# Patient Record
Sex: Female | Born: 1995 | Race: Black or African American | Hispanic: No | Marital: Single | Smoking: Never smoker
Health system: Southern US, Community
[De-identification: ages and names within clinical notes are randomized; demographics above are authoritative.]

## PROBLEM LIST (undated history)

## (undated) DIAGNOSIS — D649 Anemia, unspecified: Secondary | ICD-10-CM

## (undated) HISTORY — PX: WISDOM TOOTH EXTRACTION: SHX21

---
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
## 2015-02-23 NOTE — H&P (Signed)
L&D Evaluation:  History Expanded:  HPI 19 yo G1, EDD of 10/10/13 per 34 wk US. Pt presents with c/o regular ctx since 1930 last night, no LOF or VB. [facility] for late entry to care at 33 wks, Anemia.   Blood Type (Maternal) O positive   Group B Strep Results Maternal (Result >5wks must be treated as unknown) negative   Maternal HIV Negative   Maternal Syphilis Ab Nonreactive   Maternal Varicella Immune   Rubella Results (Maternal) immune   Patient's Medical History No Chronic Illness   Patient's Surgical History wisdom teeth   Medications Pre Natal Vitamins  colace   Allergies NKDA   Social History none   Exam:  Vital Signs stable   General no apparent distress   Mental Status clear   Chest no increased work of breathing   Abdomen gravid, tender with contractions   Pelvic no external lesions, 1/90/0 - unchanged after 1 hour   Mebranes Intact   FHT normal rate with no decels   Ucx regular   Impression:  Impression early labor   Plan:  Comments Offered therapeutic rest vs ambulation vs d/c home.  Pt opts for therapeutic rest - give Morphine 5 mg IM and recheck in am.   Electronic Signatures: Vella KohlerBrothers, Amberrose Friebel K (CNM)  (Signed 21-Dec-14 01:53)  Authored: L&D Evaluation   Last Updated: 21-Dec-14 01:53 by Vella KohlerBrothers, Wardell Pokorski K (CNM)

## 2015-03-29 ENCOUNTER — Encounter: Payer: Self-pay | Admitting: Emergency Medicine

## 2015-03-29 ENCOUNTER — Emergency Department
Admission: EM | Admit: 2015-03-29 | Discharge: 2015-03-29 | Disposition: A | Discharge status: Home / Self Care | Payer: Medicaid Other | Attending: Emergency Medicine | Admitting: Emergency Medicine

## 2015-03-29 DIAGNOSIS — L0291 Cutaneous abscess, unspecified: Secondary | ICD-10-CM

## 2015-03-29 DIAGNOSIS — L0231 Cutaneous abscess of buttock: Secondary | ICD-10-CM | POA: Insufficient documentation

## 2015-03-29 MED ORDER — HYDROCODONE-ACETAMINOPHEN 5-325 MG PO TABS
ORAL_TABLET | ORAL | Status: AC
  Administered 2015-03-29: 1 via ORAL
  Filled 2015-03-29: qty 1

## 2015-03-29 MED ORDER — HYDROCODONE-ACETAMINOPHEN 5-325 MG PO TABS: 1.0000 | ORAL_TABLET | ORAL | Status: DC | PRN

## 2015-03-29 MED ORDER — SULFAMETHOXAZOLE-TRIMETHOPRIM 800-160 MG PO TABS: 1.0000 | ORAL_TABLET | Freq: Two times a day (BID) | ORAL | Status: DC

## 2015-03-29 MED ORDER — HYDROCODONE-ACETAMINOPHEN 5-325 MG PO TABS
1.0000 | ORAL_TABLET | Freq: Once | ORAL | Status: AC
  Administered 2015-03-29: 1 via ORAL

## 2015-03-29 MED ORDER — LIDOCAINE-EPINEPHRINE (PF) 1 %-1:200000 IJ SOLN
INTRAMUSCULAR | Status: AC
  Administered 2015-03-29: 22:00:00
  Filled 2015-03-29: qty 30

## 2015-03-29 NOTE — Discharge Instructions (Signed)

## 2015-03-29 NOTE — ED Notes (Signed)
Pt to ED from home c/o abscess to right inner thigh.  Pt states started 4 days ago with pain.  Abscess popped on Saturday with yellow drainage and relieved pain.  Pain and abscess started back today.

## 2015-03-29 NOTE — ED Provider Notes (Signed)
[facility] Emergency Department Provider Note  ____________________________________________  Time seen: On arrival  I have reviewed the triage vital signs and the nursing notes.   HISTORY  Chief Complaint Boil     HPI Natalie Pierce is a 19 y.o. female who presents with complaints of pain and drainage on right buttock. She reports it started 4 days ago and it seemed like a small pimple at first but has gotten worse. It was draining yesterday but is no longer draining. She denies fevers chills nausea or vomiting. She has never had this before. She is worried it is a spider bite.     No past medical history on file.  There are no active problems to display for this patient.   No past surgical history on file.  No current outpatient prescriptions on file.  Allergies Review of patient's allergies indicates not on file.  No family history on file.  Social History History  Substance Use Topics  . Smoking status: Not on file  . Smokeless tobacco: Not on file  . Alcohol Use: Not on file    Review of Systems  Constitutional: Negative for fever.  Gastrointestinal: Negative for abdominal pain, vomiting and diarrhea. Genitourinary: Negative for dysuria. Musculoskeletal: Negative for back pain. Skin: Negative for rash. Positive for boil  Psychiatric: Positive for anxiety   ____________________________________________   PHYSICAL EXAM:  VITAL SIGNS: ED Triage Vitals  Enc Vitals Group     BP --      Pulse --      Resp --      Temp --      Temp src --      SpO2 --      Weight --      Height --      Head Cir --      Peak Flow --      Pain Score --      Pain Loc --      Pain Edu? --      Excl. in GC? --      Constitutional: Alert and oriented. Well appearing and in no distress.  Gastrointestinal: Soft and non-tender in all quadrants. No distention. There is no CVA tenderness. Genitourinary: deferred Musculoskeletal:  Nontender with normal range of motion in all extremities. No lower extremity tenderness nor edema. . Skin:  Patient with 2 x 2 centimeter area of erythema with mild underlying fluctuance inferior right buttock. Psychiatric: Mood and affect are normal. Patient exhibits appropriate insight and judgment.  ____________________________________________    LABS (pertinent positives/negatives)  Labs Reviewed - No data to display  ____________________________________________   EKG  None  ____________________________________________    RADIOLOGY  None  ____________________________________________   PROCEDURES  Procedure(s) performed: INCISION AND DRAINAGE Performed by: Jene Every Consent: Verbal consent obtained. Risks and benefits: risks, benefits and alternatives were discussed Type: abscess  Body area: buttock  Anesthesia: local infiltration  Incision was made with a scalpel.  Local anesthetic: lidocaine 1% with epinephrine  Anesthetic total: 51ml  Drainage: purulent  Drainage amount: 5 cc  Packing material: 1/4 in iodoform gauze  Patient tolerance: Patient tolerated the procedure poorly with no immediate complications.     Critical Care performed: none  ____________________________________________   INITIAL IMPRESSION / ASSESSMENT AND PLAN / ED COURSE  Pertinent labs & imaging results that were available during my care of the patient were reviewed by me and considered in my medical decision making (see chart for details).  Exam is  consistent with an abscess. We will I&D  ____________________________________________   FINAL CLINICAL IMPRESSION(S) / ED DIAGNOSES  Final diagnoses:  Abscess     Jene Every, MD 03/29/15 2139

## 2015-04-01 ENCOUNTER — Emergency Department
Admission: EM | Admit: 2015-04-01 | Discharge: 2015-04-01 | Disposition: A | Discharge status: Home / Self Care | Payer: Medicaid Other | Attending: Emergency Medicine | Admitting: Emergency Medicine

## 2015-04-01 ENCOUNTER — Encounter: Payer: Self-pay | Admitting: Emergency Medicine

## 2015-04-01 DIAGNOSIS — L0231 Cutaneous abscess of buttock: Secondary | ICD-10-CM | POA: Insufficient documentation

## 2015-04-01 DIAGNOSIS — Z792 Long term (current) use of antibiotics: Secondary | ICD-10-CM | POA: Diagnosis not present

## 2015-04-01 DIAGNOSIS — Z4801 Encounter for change or removal of surgical wound dressing: Secondary | ICD-10-CM | POA: Diagnosis present

## 2015-04-01 DIAGNOSIS — Z5189 Encounter for other specified aftercare: Secondary | ICD-10-CM

## 2015-04-01 NOTE — ED Notes (Signed)
Here for recheck of boil in her vaginal area, pt states she feels it is healing, site was packed by [employer].

## 2015-04-01 NOTE — Discharge Instructions (Signed)

## 2015-04-01 NOTE — ED Provider Notes (Signed)
CSN: [id number]     Arrival date & time 04/01/15  1826 History   First MD Initiated Contact with Patient 04/01/15 1846     Chief Complaint  Patient presents with  . Wound Check      HPI Comments: 19 year old female presents today for re-check of abscess that was drained 3 days ago in the ER by Dr. Jene Every. Packing was placed at that time, but she reports it has fallen out. She is having some drainage from the area. It hurts to sit and walk around. Taking Bactrim and Norco. No fevers, sweats, chills, nausea or body aches.   Patient is a 19 y.o. female presenting with wound check. The history is provided by the patient.  Wound Check This is a new problem. Episode onset: 3 days ago. The problem has been gradually improving. Pertinent negatives include no arthralgias, chills, fever or myalgias. The symptoms are aggravated by walking and standing. Treatments tried: oral narcotics  The treatment provided moderate relief.    History reviewed. No pertinent past medical history. Past Surgical History  Procedure Laterality Date  . Wisdom tooth extraction     No family history on file. History  Substance Use Topics  . Smoking status: Never Smoker   . Smokeless tobacco: Not on file  . Alcohol Use: No   OB History    No data available     Review of Systems  Constitutional: Negative for fever and chills.  Musculoskeletal: Negative for myalgias and arthralgias.  Skin: Positive for wound.  All other systems reviewed and are negative.     Allergies  Review of patient's allergies indicates no known allergies.  Home Medications   Prior to Admission medications   Medication Sig Start Date End Date Taking? Authorizing Provider  HYDROcodone-acetaminophen (NORCO/VICODIN) 5-325 MG per tablet Take 1 tablet by mouth every 4 (four) hours as needed for moderate pain. 03/29/15   Jene Every, MD  sulfamethoxazole-trimethoprim (BACTRIM DS,SEPTRA DS) 800-160 MG per tablet Take 1 tablet by  mouth 2 (two) times daily. 03/29/15   Jene Every, MD   Ht 5\' 5"  (1.651 m)  Wt 140 lb (63.504 kg)  BMI 23.30 kg/m2  LMP 02/26/2015 Physical Exam  Constitutional: She is oriented to person, place, and time. Vital signs are normal. She appears well-developed and well-nourished.  Non-toxic appearance. She does not have a sickly appearance. She does not appear ill.  HENT:  Head: Normocephalic and atraumatic.  Genitourinary:    Pelvic exam was performed with patient prone.  Above exam chaperoned by Eli Phillips, ER Tech.   Musculoskeletal: Normal range of motion.  Neurological: She is alert and oriented to person, place, and time.  Psychiatric: She has a normal mood and affect. Her behavior is normal. Judgment and thought content normal.  Nursing note and vitals reviewed.   ED Course  Procedures (including critical care time) Labs Review Labs Reviewed - No data to display  Imaging Review No results found.   EKG Interpretation None      MDM  Well healed area of prior abscess. Advised patient to continue sitz baths, take all bactrim course. Norco as needed. No further follow up unless area becomes larger, she has increased drainage or redness.  Final diagnoses:  Abscess of buttock  Encounter for wound re-check        Luvenia Redden, PA-C 04/01/15 1923

## 2016-05-16 ENCOUNTER — Emergency Department
Admission: EM | Admit: 2016-05-16 | Discharge: 2016-05-16 | Disposition: A | Discharge status: Home / Self Care | Payer: Medicaid Other | Attending: Emergency Medicine | Admitting: Emergency Medicine

## 2016-05-16 ENCOUNTER — Encounter: Payer: Self-pay | Admitting: Emergency Medicine

## 2016-05-16 ENCOUNTER — Emergency Department: Payer: Medicaid Other

## 2016-05-16 DIAGNOSIS — N83202 Unspecified ovarian cyst, left side: Secondary | ICD-10-CM | POA: Insufficient documentation

## 2016-05-16 DIAGNOSIS — R109 Unspecified abdominal pain: Secondary | ICD-10-CM

## 2016-05-16 DIAGNOSIS — R1030 Lower abdominal pain, unspecified: Secondary | ICD-10-CM | POA: Diagnosis present

## 2016-05-16 LAB — CBC
HEMATOCRIT: 37.6 % (ref 35.0–47.0)
HEMOGLOBIN: 13.1 g/dL (ref 12.0–16.0)
MCH: 31.4 pg (ref 26.0–34.0)
MCHC: 34.8 g/dL (ref 32.0–36.0)
MCV: 90.2 fL (ref 80.0–100.0)
Platelets: 244 10*3/uL (ref 150–440)
RBC: 4.17 MIL/uL (ref 3.80–5.20)
RDW: 13.7 % (ref 11.5–14.5)
WBC: 6.5 10*3/uL (ref 3.6–11.0)

## 2016-05-16 LAB — COMPREHENSIVE METABOLIC PANEL
ALBUMIN: 3.9 g/dL (ref 3.5–5.0)
ALT: 9 U/L — ABNORMAL LOW (ref 14–54)
ANION GAP: 7 (ref 5–15)
AST: 14 U/L — AB (ref 15–41)
Alkaline Phosphatase: 65 U/L (ref 38–126)
BILIRUBIN TOTAL: 0.6 mg/dL (ref 0.3–1.2)
BUN: 10 mg/dL (ref 6–20)
CHLORIDE: 106 mmol/L (ref 101–111)
CO2: 26 mmol/L (ref 22–32)
Calcium: 9.3 mg/dL (ref 8.9–10.3)
Creatinine, Ser: 0.64 mg/dL (ref 0.44–1.00)
GFR calc Af Amer: >60 mL/min (ref >60)
GFR calc non Af Amer: >60 mL/min (ref >60)
GLUCOSE: 97 mg/dL (ref 65–99)
POTASSIUM: 3.7 mmol/L (ref 3.5–5.1)
Sodium: 139 mmol/L (ref 135–145)
TOTAL PROTEIN: 7.6 g/dL (ref 6.5–8.1)

## 2016-05-16 LAB — WET PREP, GENITAL
Clue Cells Wet Prep HPF POC: NONE SEEN
Sperm: NONE SEEN
Trich, Wet Prep: NONE SEEN
YEAST WET PREP: NONE SEEN

## 2016-05-16 LAB — URINALYSIS COMPLETE WITH MICROSCOPIC (ARMC ONLY)
BACTERIA UA: NONE SEEN
Bilirubin Urine: NEGATIVE
Glucose, UA: NEGATIVE mg/dL
HGB URINE DIPSTICK: NEGATIVE
Ketones, ur: NEGATIVE mg/dL
LEUKOCYTES UA: NEGATIVE
NITRITE: NEGATIVE
PROTEIN: NEGATIVE mg/dL
RBC / HPF: NONE SEEN RBC/hpf (ref 0–5)
SPECIFIC GRAVITY, URINE: 1.018 (ref 1.005–1.030)
pH: 7 (ref 5.0–8.0)

## 2016-05-16 LAB — CHLAMYDIA/NGC RT PCR (ARMC ONLY)
Chlamydia Tr: NOT DETECTED
N GONORRHOEAE: NOT DETECTED

## 2016-05-16 LAB — PREGNANCY, URINE: PREG TEST UR: NEGATIVE

## 2016-05-16 LAB — LIPASE, BLOOD: LIPASE: 23 U/L (ref 11–51)

## 2016-05-16 MED ORDER — IBUPROFEN 600 MG PO TABS
600.0000 mg | ORAL_TABLET | Freq: Once | ORAL | Status: AC
  Administered 2016-05-16: 600 mg via ORAL
  Filled 2016-05-16: qty 1

## 2016-05-16 NOTE — ED Provider Notes (Signed)
[facility] Emergency Department Provider Note   ____________________________________________   None    (approximate)  I have reviewed the triage vital signs and the nursing notes.   HISTORY  Chief Complaint Back Pain and Abdominal Pain    HPI Natalie Pierce is a 20 y.o. female who comes into the hospital today with lower abdominal pain. She reports that the pain has come and gone during the day. The patient is also having some pain on both sides of her back. She reports that the pain in her back started over the weekend but the lower abdominal pain started today. The patient has never had this before. She reports that the pain is crampy. She has not taken anything for pain. She rates her pain a 5 out of 10 in intensity. The patient's last period was about 2 weeks ago. She's had no vomiting no diarrhea no chest pain, no shortness of breath, no headache, no blurred vision, no vaginal discharge no pain with urination. The patient was unsure what was going on so she is here for evaluation.   History reviewed. No pertinent past medical history.  There are no active problems to display for this patient.   Past Surgical History:  Procedure Laterality Date  . WISDOM TOOTH EXTRACTION      Prior to Admission medications   Not on File    Allergies Review of patient's allergies indicates no known allergies.  No family history on file.  Social History Social History  Substance Use Topics  . Smoking status: Never Smoker  . Smokeless tobacco: Never Used  . Alcohol use No    Review of Systems Constitutional: No fever/chills Eyes: No visual changes. ENT: No sore throat. Cardiovascular: Denies chest pain. Respiratory: Denies shortness of breath. Gastrointestinal:  abdominal pain.  No nausea, no vomiting.  No diarrhea.  No constipation. Genitourinary: Negative for dysuria. Musculoskeletal:  back pain. Skin: Negative for rash. Neurological:  Negative for headaches, focal weakness or numbness.  10-point ROS otherwise negative.  ____________________________________________   PHYSICAL EXAM:  VITAL SIGNS: ED Triage Vitals  Enc Vitals Group     BP 05/16/16 0407 126/82     Pulse Rate 05/16/16 0407 69     Resp 05/16/16 0407 18     Temp 05/16/16 0407 97.9 F (36.6 C)     Temp Source 05/16/16 0407 Oral     SpO2 05/16/16 0407 100 %     Weight 05/16/16 0407 150 lb (68 kg)     Height 05/16/16 0407  (1.651 m)     Head Circumference --      Peak Flow --      Pain Score 05/16/16 0408 6     Pain Loc --      Pain Edu? --      Excl. in GC? --     Constitutional: Alert and oriented. Well appearing and in Mild distress. Eyes: Conjunctivae are normal. PERRL. EOMI. Head: Atraumatic. Nose: No congestion/rhinnorhea. Mouth/Throat: Mucous membranes are moist.  Oropharynx non-erythematous. Cardiovascular: Normal rate, regular rhythm. Grossly normal heart sounds.  Good peripheral circulation. Respiratory: Normal respiratory effort.  No retractions. Lungs CTAB. Gastrointestinal: Soft with some left lower quadrant pain with palpation. No distention. Positive bowel sounds mild CVA tenderness. Genitourinary: Normal external genitalia, left adnexal tenderness to palpation, no bleeding, no significant discharge, no cervical motion tenderness. Musculoskeletal: No lower extremity tenderness nor edema.  . Neurologic:  Normal speech and language.  Skin:  Skin is warm,  dry and intact.  Psychiatric: Mood and affect are normal.   ____________________________________________   LABS (all labs ordered are listed, but only abnormal results are displayed)  Labs Reviewed  WET PREP, GENITAL - Abnormal; Notable for the following:       Result Value   WBC, Wet Prep HPF POC FEW (*)    All other components within normal limits  COMPREHENSIVE METABOLIC PANEL - Abnormal; Notable for the following:    AST 14 (*)    ALT 9 (*)    All other  components within normal limits  URINALYSIS COMPLETEWITH MICROSCOPIC ([facility] ONLY) - Abnormal; Notable for the following:    Color, Urine YELLOW (*)    APPearance CLEAR (*)    Squamous Epithelial / LPF 0-5 (*)    All other components within normal limits  CHLAMYDIA/NGC RT PCR ([facility] ONLY)  LIPASE, BLOOD  CBC  PREGNANCY, URINE   ____________________________________________  EKG  none ____________________________________________  RADIOLOGY  none ____________________________________________   PROCEDURES  Procedure(s) performed: None  Procedures  Critical Care performed: No  ____________________________________________   INITIAL IMPRESSION / ASSESSMENT AND PLAN / ED COURSE  Pertinent labs & imaging results that were available during my care of the patient were reviewed by me and considered in my medical decision making (see chart for details).  This is a 20 year old female who comes into the hospital today with left lower quadrant abdominal pain. She is also having some back pain. The patient's blood work and urine are unremarkable. I will send the patient for a pelvic ultrasound to evaluate for possible cyst causing her symptoms.  Clinical Course  Value Comment By Time  US Pelvis Complete Dominant physiologic follicle left ovary as described. No other extrauterine pelvic or adnexal mass. Uterus retroverted. No intrauterine lesion. Study otherwise unremarkable. No demonstrable ovarian torsion Rebecka Apley, MD 08/01 [phone]    The patient's wet prep is unremarkable. She'll be discharged home to follow-up with OB/GYN. I will give her a dose of ibuprofen here in the emergency department. ____________________________________________   FINAL CLINICAL IMPRESSION(S) / ED DIAGNOSES  Final diagnoses:  Abdominal pain  Cyst of left ovary      NEW MEDICATIONS STARTED DURING THIS VISIT:  New Prescriptions   No medications on file     Note:  This document  was prepared using Dragon voice recognition software and may include unintentional dictation errors.    Rebecka Apley, MD 05/16/16 [phone]

## 2016-05-16 NOTE — ED Notes (Signed)
Pt in US

## 2016-05-16 NOTE — ED Notes (Signed)
Patient transported to Ultrasound via stretcher 

## 2016-05-16 NOTE — ED Triage Notes (Signed)
Patient reports that she developed intermittent lower back pain over the weekend. Patient reports that tonight she developed left lower abd pain. Patient denies nausea/vomiting, diarrhea, fever or urinary symptoms.

## 2016-05-16 NOTE — ED Notes (Signed)
Pt c/o intermittent lower abdominal pain since today and back pain began over the weekend. Pt denies urinary symptoms, nausea, vomiting or diarrhea.

## 2017-10-16 NOTE — L&D Delivery Note (Signed)
Vaginal Delivery Note  Spontaneous delivery of nonviable female infant. Infant delivered suddenly by nurse at bedside. When I walked in the room infant was on the bed in a large puddle of meconium amniotic fluid. Infant dried and placed on maternal chest after confirming that this is what the patient desired. Mother distraught. Umbilical cord was clamped and cut. Placenta was delivered intact. There was no knot in the cord. No evidence of abruption with the placenta. There was not a nuchal cord or a body cord per nursing as the infant delivered. The patient had a first degree perineal laceration that was repaired with 2-0 vicryl. Uterus firm and below umbilicus at the end of the delivery.  Mom recovering in stable condition. Sponge and needle counts were correct at the end of the delivery. Inspection of infant showed a normal infant with no morphological abnormalities.   APGARS: 1 minute:0 5 minutes: 0  Natalie Idlerhristanna Eulalio Reamy MD [facility], [facility] 10/15/18 5:46 PM

## 2017-10-18 ENCOUNTER — Ambulatory Visit: Payer: Self-pay | Admitting: Advanced Practice Midwife

## 2017-11-16 ENCOUNTER — Ambulatory Visit (INDEPENDENT_AMBULATORY_CARE_PROVIDER_SITE_OTHER): Payer: Medicaid Other | Admitting: Advanced Practice Midwife

## 2017-11-16 ENCOUNTER — Encounter: Payer: Self-pay | Admitting: Advanced Practice Midwife

## 2017-11-16 VITALS — BP 120/90 | HR 74 | Ht 65.0 in | Wt 180.0 lb

## 2017-11-16 DIAGNOSIS — Z309 Encounter for contraceptive management, unspecified: Secondary | ICD-10-CM | POA: Diagnosis not present

## 2017-11-16 DIAGNOSIS — Z3049 Encounter for surveillance of other contraceptives: Secondary | ICD-10-CM | POA: Diagnosis not present

## 2017-11-16 DIAGNOSIS — Z30011 Encounter for initial prescription of contraceptive pills: Secondary | ICD-10-CM

## 2017-11-16 DIAGNOSIS — Z01419 Encounter for gynecological examination (general) (routine) without abnormal findings: Secondary | ICD-10-CM

## 2017-11-16 DIAGNOSIS — Z124 Encounter for screening for malignant neoplasm of cervix: Secondary | ICD-10-CM

## 2017-11-16 MED ORDER — NORGESTIMATE-ETH ESTRADIOL 0.25-35 MG-MCG PO TABS: 1.0000 | ORAL_TABLET | Freq: Every day | ORAL | 11 refills | Status: DC

## 2017-11-16 NOTE — Progress Notes (Signed)
Patient ID: Natalie Pierce, female   DOB: [birth year], 22 y.o.   MRN: [id number]     Gynecology Annual Exam  PCP: Clemetine Marker, FNP  Chief Complaint:  Chief Complaint  Patient presents with  . Gynecologic Exam    History of Present Illness: Patient is a 22 y.o. G1P1001 presents for annual exam. The patient has no complaints today. She has a Nexplanon that was placed 4 years ago and is overdue for taking out. Discussion of birth control options. Patient requests removal of Nexplanon and start OCP.    LMP: No LMP recorded. Menarche:not applicable Average Interval: no periods on Nexplanon Duration of flow: NA Heavy Menses: NA Clots: NA Intermenstrual Bleeding: NA Postcoital Bleeding: no Dysmenorrhea: not applicable  The patient is sexually active. She currently uses Nexplanon for contraception. She denies dyspareunia.  The patient does not perform self breast exams.  There is no notable family history of breast or ovarian cancer in her family.  The patient wears seatbelts: yes.  The patient has regular exercise: she walks some days about 10 minutes.    The patient denies current symptoms of depression.    Review of Systems: Review of Systems  Constitutional: Negative.   HENT: Negative.   Eyes: Negative.   Respiratory: Negative.   Cardiovascular: Negative.   Gastrointestinal: Negative.   Genitourinary: Negative.   Musculoskeletal: Negative.   Skin: Negative.   Neurological: Negative.   Endo/Heme/Allergies: Negative.   Psychiatric/Behavioral: Negative.     Past Medical History:  No past medical history on file.  Past Surgical History:  Past Surgical History:  Procedure Laterality Date  . WISDOM TOOTH EXTRACTION      Gynecologic History:  No LMP recorded. Contraception: Nexplanon Last Pap: Patient has never had a PAP   Obstetric History: G1P1001  Family History:  No family history on file.  Social History:  Social History   Socioeconomic History   . Marital status: Single    Spouse name: Not on file  . Number of children: Not on file  . Years of education: Not on file  . Highest education level: Not on file  Social Needs  . Financial resource strain: Not on file  . Food insecurity - worry: Not on file  . Food insecurity - inability: Not on file  . Transportation needs - medical: Not on file  . Transportation needs - non-medical: Not on file  Occupational History  . Not on file  Tobacco Use  . Smoking status: Never Smoker  . Smokeless tobacco: Never Used  Substance and Sexual Activity  . Alcohol use: No  . Drug use: No  . Sexual activity: Yes    Birth control/protection: Implant  Other Topics Concern  . Not on file  Social History Narrative  . Not on file    Allergies:  No Known Allergies  Medications: Prior to Admission medications   Medication Sig Start Date End Date Taking? Authorizing Provider  etonogestrel (NEXPLANON) 68 MG IMPL implant 1 each by Subdermal route once.   Yes [provider]    Physical Exam Vitals: Blood pressure 120/90, pulse 74, height 5\' 5"  (1.651 m), weight 180 lb (81.6 kg).  General: NAD HEENT: normocephalic, anicteric Thyroid: no enlargement, no palpable nodules Pulmonary: No increased work of breathing, CTAB Cardiovascular: RRR, distal pulses 2+ Breast: Breast symmetrical, no tenderness, no palpable nodules or masses, no skin or nipple retraction present, no nipple discharge.  No axillary or supraclavicular lymphadenopathy. Abdomen: NABS, soft, non-tender, non-distended.  Umbilicus without lesions.  No hepatomegaly, splenomegaly or masses palpable. No evidence of hernia  Genitourinary:  External: Normal external female genitalia.  Normal urethral meatus, normal  Bartholin's and Skene's glands.    Vagina: Normal vaginal mucosa, no evidence of prolapse.    Cervix: Grossly normal in appearance, no bleeding, no CMT  Uterus: Non-enlarged, mobile, normal contour.    Adnexa:  ovaries non-enlarged, no adnexal masses  Rectal: deferred  Lymphatic: no evidence of inguinal lymphadenopathy Extremities: no edema, erythema, or tenderness Neurologic: Grossly intact Psychiatric: mood appropriate, affect full   Assessment: 22 y.o. G1P1001 routine annual exam  Plan: Problem List Items Addressed This Visit    None      1) 4) Gardasil Series discussed and if applicable offered to patient - Patient does not know if she has had the vaccine  2) STI screening was offered and declined   3) ASCCP guidelines and rational discussed.  Patient opts for yearly screening interval  4) Contraception - Discontinue Nexplanon today and start oral combined pills  5) Encouraged increase healthy lifestyle diet and exercise  6) Follow up 1 year for routine annual exam  Tresea MallJane Suzann Lazaro, CNM     GYNECOLOGY PROCEDURE NOTE  Nexplanon removal discussed in detail.  Risks of infection, bleeding, nerve injury all reviewed.  Patient understands risks and desires to proceed.  Verbal consent obtained.  Patient is certain she wants the Nexplanon removed.  All questions answered.  Procedure: Patient placed in dorsal supine with left arm above head, elbow flexed at 90 degrees, arm resting on examination table.  Nexplanon identified without problems.  Betadine scrub x2.  1 ml of 1% lidocaine injected under Nexplanon device without problems.  Sterile gloves applied.  Small 0.5cm incision made at distal tip of Nexplanon device with 11 blade scalpel.  Nexplanon brought to incision and grasped with a small kelly clamp.  Nexplanon removed intact without problems.  Pressure applied to incision.  Hemostasis obtained.  Steri-strips applied, followed by bandage and compression dressing.  Patient tolerated procedure well.  No complications.   Assessment: 22 y.o. year old female now s/p uncomplicated Nexplanon removal.  Plan: 1.  Patient given post procedure precautions and asked to call for fever,  chills, redness or drainage from her incision, bleeding from incision.  She understands she will likely have a small bruise near site of removal and can remove bandage tomorrow and steri-strips in approximately 1 week.  2) Contraception: Sprintec  Tresea MallJane Shley Dolby, [place]   [id number] for lidocaine block, [phone] for nexplanon removal

## 2017-11-16 NOTE — Patient Instructions (Signed)
Preventive Care 18-39 Years, Female Preventive care refers to lifestyle choices and visits with your health care provider that can promote health and wellness. What does preventive care include?  A yearly physical exam. This is also called an annual well check.  Dental exams once or twice a year.  Routine eye exams. Ask your health care provider how often you should have your eyes checked.  Personal lifestyle choices, including: ? Daily care of your teeth and gums. ? Regular physical activity. ? Eating a healthy diet. ? Avoiding tobacco and drug use. ? Limiting alcohol use. ? Practicing safe sex. ? Taking vitamin and mineral supplements as recommended by your health care provider. What happens during an annual well check? The services and screenings done by your health care provider during your annual well check will depend on your age, overall health, lifestyle risk factors, and family history of disease. Counseling Your health care provider may ask you questions about your:  Alcohol use.  Tobacco use.  Drug use.  Emotional well-being.  Home and relationship well-being.  Sexual activity.  Eating habits.  Work and work Statistician.  Method of birth control.  Menstrual cycle.  Pregnancy history.  Screening You may have the following tests or measurements:  Height, weight, and BMI.  Diabetes screening. This is done by checking your blood sugar (glucose) after you have not eaten for a while (fasting).  Blood pressure.  Lipid and cholesterol levels. These may be checked every 5 years starting at age 32.  Skin check.  Hepatitis C blood test.  Hepatitis B blood test.  Sexually transmitted disease (STD) testing.  BRCA-related cancer screening. This may be done if you have a family history of breast, ovarian, tubal, or peritoneal cancers.  Pelvic exam and Pap test. This may be done every 3 years starting at age 68. Starting at age 6, this may be done  every 5 years if you have a Pap test in combination with an HPV test.  Discuss your test results, treatment options, and if necessary, the need for more tests with your health care provider. Vaccines Your health care provider may recommend certain vaccines, such as:  Influenza vaccine. This is recommended every year.  Tetanus, diphtheria, and acellular pertussis (Tdap, Td) vaccine. You may need a Td booster every 10 years.  Varicella vaccine. You may need this if you have not been vaccinated.  HPV vaccine. If you are 25 or younger, you may need three doses over 6 months.  Measles, mumps, and rubella (MMR) vaccine. You may need at least one dose of MMR. You may also need a second dose.  Pneumococcal 13-valent conjugate (PCV13) vaccine. You may need this if you have certain conditions and were not previously vaccinated.  Pneumococcal polysaccharide (PPSV23) vaccine. You may need one or two doses if you smoke cigarettes or if you have certain conditions.  Meningococcal vaccine. One dose is recommended if you are age 42-21 years and a first-year college student living in a residence hall, or if you have one of several medical conditions. You may also need additional booster doses.  Hepatitis A vaccine. You may need this if you have certain conditions or if you travel or work in places where you may be exposed to hepatitis A.  Hepatitis B vaccine. You may need this if you have certain conditions or if you travel or work in places where you may be exposed to hepatitis B.  Haemophilus influenzae type b (Hib) vaccine. You may need this  if you have certain risk factors.  Talk to your health care provider about which screenings and vaccines you need and how often you need them. This information is not intended to replace advice given to you by your health care provider. Make sure you discuss any questions you have with your health care provider. Document Released: 11/28/2001 Document Revised:  06/21/2016 Document Reviewed: 08/03/2015 Elsevier Interactive Patient Education  [employer].

## 2017-11-19 LAB — IGP, RFX APTIMA HPV ASCU: PAP Smear Comment: 0

## 2018-03-14 ENCOUNTER — Ambulatory Visit (INDEPENDENT_AMBULATORY_CARE_PROVIDER_SITE_OTHER): Payer: Medicaid Other | Admitting: Advanced Practice Midwife

## 2018-03-14 ENCOUNTER — Encounter: Payer: Self-pay | Admitting: Advanced Practice Midwife

## 2018-03-14 VITALS — BP 116/68 | HR 80 | Wt 177.0 lb

## 2018-03-14 DIAGNOSIS — Z3201 Encounter for pregnancy test, result positive: Secondary | ICD-10-CM | POA: Diagnosis not present

## 2018-03-14 DIAGNOSIS — Z3480 Encounter for supervision of other normal pregnancy, unspecified trimester: Secondary | ICD-10-CM | POA: Insufficient documentation

## 2018-03-14 DIAGNOSIS — Z113 Encounter for screening for infections with a predominantly sexual mode of transmission: Secondary | ICD-10-CM

## 2018-03-14 DIAGNOSIS — N912 Amenorrhea, unspecified: Secondary | ICD-10-CM

## 2018-03-14 LAB — POCT URINE PREGNANCY: PREG TEST UR: POSITIVE — AB

## 2018-03-14 NOTE — Addendum Note (Signed)
Addended by: Tresea Mall on: 03/14/2018 02:56 PM   Modules accepted: Orders

## 2018-03-14 NOTE — Addendum Note (Signed)
Addended by: [place], Lynsee Wands B on: 03/14/2018 03:29 PM   Modules accepted: Orders

## 2018-03-14 NOTE — Progress Notes (Signed)
New Obstetric Patient H&P    Chief Complaint: "Desires prenatal care"   History of Present Illness: Patient is a 22 y.o. G2P1001 Not Hispanic or Latino female, presents with amenorrhea and positive home pregnancy test. Patient's last menstrual period was 01/14/2018 (exact date). and based on her  LMP, her EDD is Estimated Date of Delivery: 10/21/18 and her EGA is [redacted]w[redacted]d. Cycles are 7. days, regular, and occur approximately every : 28 days. Her last pap smear was 3 months ago and was no abnormalities.    She had a urine pregnancy test which was positive 3 week(s)  ago. Her last menstrual period was normal and lasted for  7 day(s). Since her LMP she claims she has experienced breast tenderness, fatigue, nausea, vomiting. She denies vaginal bleeding. Her past medical history is noncontributory. Her prior pregnancies are notable for full term SVD.   Since her LMP, she admits to the use of tobacco products  no She claims she has gained   7 pounds since the start of her pregnancy.  There are cats in the home in the home  no  She admits close contact with children on a regular basis  yes  She has had chicken pox in the past no She has had Tuberculosis exposures, symptoms, or previously tested positive for TB   no Current or past history of domestic violence. no  Genetic Screening/Teratology Counseling: (Includes patient, baby's father, or anyone in either family with:)   1. Patient's age >/= 11 at [facility]  no 2. Thalassemia ([place], [place], Mediterranean, or Asian background): MCV<80  no 3. Neural tube defect (meningomyelocele, spina bifida, anencephaly)  no 4. Congenital heart defect  no  5. Down syndrome  no 6. Tay-Sachs (Jewish, [place]  no 7. Canavan's Disease  no 8. Sickle cell disease or trait (African)  no  9. Hemophilia or other blood disorders  no  10. Muscular dystrophy  no  11. Cystic fibrosis  no  12. Huntington's Chorea  no  13. Mental retardation/autism  no 14. Other  inherited genetic or chromosomal disorder  no 15. Maternal metabolic disorder (DM, PKU, etc)  no 16. Patient or FOB with a child with a birth defect not listed above no  16a. Patient or FOB with a birth defect themselves no 17. Recurrent pregnancy loss, or stillbirth  no  18. Any medications since LMP other than prenatal vitamins (include vitamins, supplements, OTC meds, drugs, alcohol)  no 19. Any other genetic/environmental exposure to discuss  no  Infection History:   1. Lives with someone with TB or TB exposed  no  2. Patient or partner has history of genital herpes  no 3. Rash or viral illness since LMP  no 4. History of STI (GC, CT, HPV, syphilis, HIV)  no 5. History of recent travel :  no  Other pertinent information:  no     Review of Systems:10 point review of systems negative unless otherwise noted in HPI  Past Medical History:  History reviewed. No pertinent past medical history.  Past Surgical History:  Past Surgical History:  Procedure Laterality Date  . WISDOM TOOTH EXTRACTION      Gynecologic History: Patient's last menstrual period was 01/14/2018 (exact date).  Obstetric History: G2P1001  Family History:  History reviewed. No pertinent family history.  Social History:  Social History   Socioeconomic History  . Marital status: Single    Spouse name: Not on file  . Number of children: Not on file  .  Years of education: Not on file  . Highest education level: Not on file  Occupational History  . Not on file  Social Needs  . Financial resource strain: Not on file  . Food insecurity:    Worry: Not on file    Inability: Not on file  . Transportation needs:    Medical: Not on file    Non-medical: Not on file  Tobacco Use  . Smoking status: Never Smoker  . Smokeless tobacco: Never Used  Substance and Sexual Activity  . Alcohol use: Yes    Comment: Occ  . Drug use: No  . Sexual activity: Yes    Birth control/protection: Implant  Lifestyle  .  Physical activity:    Days per week: Not on file    Minutes per session: Not on file  . Stress: Not on file  Relationships  . Social connections:    Talks on phone: Not on file    Gets together: Not on file    Attends religious service: Not on file    Active member of club or organization: Not on file    Attends meetings of clubs or organizations: Not on file    Relationship status: Not on file  . Intimate partner violence:    Fear of current or ex partner: Not on file    Emotionally abused: Not on file    Physically abused: Not on file    Forced sexual activity: Not on file  Other Topics Concern  . Not on file  Social History Narrative  . Not on file    Allergies:  No Known Allergies  Medications: Prior to Admission medications   Not on File    Physical Exam Vitals: Blood pressure 116/68, pulse 80, weight 177 lb (80.3 kg), last menstrual period 01/14/2018.  General: NAD HEENT: normocephalic, anicteric Thyroid: no enlargement, no palpable nodules Pulmonary: No increased work of breathing, CTAB Cardiovascular: RRR, distal pulses 2+ Abdomen: NABS, soft, non-tender, non-distended.  Umbilicus without lesions.  No hepatomegaly, splenomegaly or masses palpable. No evidence of hernia  Genitourinary:  External: Normal external female genitalia.  Normal urethral meatus, normal  Bartholin's and Skene's glands.    Vagina: Normal vaginal mucosa, no evidence of prolapse.    Cervix: Grossly normal in appearance, no bleeding, no CMT  Uterus: Enlarged, mobile, normal contour.    Adnexa: ovaries non-enlarged, no adnexal masses  Rectal: deferred Extremities: no edema, erythema, or tenderness Neurologic: Grossly intact Psychiatric: mood appropriate, affect full   Assessment: 22 y.o. G2P1001 at [redacted]w[redacted]d presenting to initiate prenatal care  Plan: 1) Avoid alcoholic beverages. 2) Patient encouraged not to smoke.  3) Discontinue the use of all non-medicinal drugs and chemicals.  4)  Take prenatal vitamins daily.  5) Nutrition, food safety (fish, cheese advisories, and high nitrite foods) and exercise discussed. 6) Hospital and practice style discussed with cross coverage system.  7) Genetic Screening, such as with 1st Trimester Screening, cell free fetal DNA, AFP testing, and Ultrasound, as well as with amniocentesis and CVS as appropriate, is discussed with patient. At the conclusion of today's visit patient requested cell free DNA genetic testing 8) Patient is asked about travel to areas at risk for the Zika virus, and counseled to avoid travel and exposure to mosquitoes or sexual partners who may have themselves been exposed to the virus. Testing is discussed, and will be ordered as appropriate.    Tresea Mall, CNM [facility], [facility] 03/14/2018, 10:25 AM

## 2018-03-14 NOTE — Patient Instructions (Signed)
Exercise During Pregnancy For people of all ages, exercise is an important part of being healthy. Exercise improves heart and lung function and helps to maintain strength, flexibility, and a healthy body weight. Exercise also boosts energy levels and elevates mood. For most women, maintaining an exercise routine throughout pregnancy is recommended. It is only on rare occasions and with certain medical conditions or pregnancy complications that women may be asked to limit or avoid exercise during pregnancy. What are some other benefits to exercising during pregnancy? Along with maintaining strength and flexibility, exercising throughout pregnancy can help to:  Keep strength in muscles that are very important during labor and childbirth.  Decrease low back pain during pregnancy.  Decrease the risk of developing gestational diabetes mellitus (GDM).  Improve blood sugar (glucose) control for women who have GDM.  Decrease the risk of developing preeclampsia. This is a serious condition that causes high blood pressure along with other symptoms, such as swelling and headaches.  Decrease the risk of cesarean delivery.  Speed up the recovery after giving birth.  How often should I exercise? Unless your health care provider gives you different instructions, you should try to exercise on most days or all days of the week. In general, try to exercise with moderate intensity for about 150 minutes per week. This can be spread out across several days, such as exercising for 30 minutes per day on 5 days of each week. You can tell that you are exercising at a moderate intensity if you have a higher heart rate and faster breathing, but you are still able to hold a conversation. What types of moderate-intensity exercise are recommended during pregnancy? There are many types of exercise that are safe for you to do during pregnancy. Unless your health care provider gives you different instructions, do a variety of  exercises that safely increase your heart and breathing (cardiopulmonary) rates and help you to build and maintain muscle strength (strength training). You should always be able to talk in full sentences while exercising during pregnancy. Some examples of exercising that is safe to do during pregnancy include:  Brisk walking or hiking.  Swimming.  Water aerobics.  Riding a stationary bike.  Strength training.  Modified yoga or Pilates. Tell your instructor that you are pregnant. Avoid overstretching and avoid lying on your back for long periods of time.  Running or jogging. Only choose this type of exercise if: ? You ran or jogged regularly before your pregnancy. ? You can run or jog and still talk in complete sentences.  What types of exercise should I not do during pregnancy? Depending on your level of fitness and whether you exercised regularly before your pregnancy, you may be advised to limit vigorous-intensity exercise during your pregnancy. You can tell that you are exercising at a vigorous intensity if you are breathing much harder and faster and cannot hold a conversation while exercising. Some examples of exercising that you should avoid during pregnancy include:  Contact sports.  Activities that place you at risk for falling on or being hit in the belly, such as downhill skiing, water skiing, surfing, rock climbing, cycling, gymnastics, and horseback riding.  Scuba diving.  Sky diving.  Yoga or Pilates in a room that is heated to extreme temperatures ("hot yoga" or "hot Pilates").  Jogging or running, unless you ran or jogged regularly before your pregnancy. While jogging or running, you should always be able to talk in full sentences. Do not run or jog so vigorously   that you are unable to have a conversation.  If you are not used to exercising at elevation (more than 6,000 feet above sea level), do not do so during your pregnancy.  When should I avoid exercising  during pregnancy? Certain medical conditions can make it unsafe to exercise during pregnancy, or they may increase your risk of miscarriage or early labor and birth. Some of these conditions include:  Some types of heart disease.  Some types of lung disease.  Placenta previa. This is when the placenta partially or completely covers the opening of the uterus (cervix).  Frequent bleeding from the vagina during your pregnancy.  Incompetent cervix. This is when your cervix does not remain as tightly closed during pregnancy as it should.  Premature labor.  Ruptured membranes. This is when the protective sac (amniotic sac) opens up and amniotic fluid leaks from your vagina.  Severely low blood count (anemia).  Preeclampsia or pregnancy-caused high blood pressure.  Carrying more than one baby (multiple gestation) and having an additional risk of early labor.  Poorly controlled diabetes.  Being severely underweight or severely overweight.  Intrauterine growth restriction. This is when your baby's growth and development during pregnancy are slower than expected.  Other medical conditions. Ask your health care provider if any apply to you.  What else should I know about exercising during pregnancy? You should take these precautions while exercising during pregnancy:  Avoid overheating. ? Wear loose-fitting, breathable clothes. ? Do not exercise in very high temperatures.  Avoid dehydration. Drink enough water before, during, and after exercise to keep your urine clear or pale yellow.  Avoid overstretching. Because of hormone changes during pregnancy, it is easy to overstretch muscles, tendons, and ligaments during pregnancy.  Start slowly and ask your health care provider to recommend types of exercise that are safe for you, if exercising regularly is new for you.  Pregnancy is not a time for exercising to lose weight. When should I seek medical care? You should stop exercising  and call your health care provider if you have any unusual symptoms, such as:  Mild uterine contractions or abdominal cramping.  Dizziness that does not improve with rest.  When should I seek immediate medical care? You should stop exercising and call your local emergency services (911 in the [place] if you have any unusual symptoms, such as:  Sudden, severe pain in your low back or your belly.  Uterine contractions or abdominal cramping that do not improve with rest.  Chest pain.  Bleeding or fluid leaking from your vagina.  Shortness of breath.  This information is not intended to replace advice given to you by your health care provider. Make sure you discuss any questions you have with your health care provider. Document Released: 10/02/2005 Document Revised: 03/01/2016 Document Reviewed: 12/10/2014 [employer] Interactive Patient Education  2018 [employer] Eating Plan for Pregnant Women While you are pregnant, your body will require additional nutrition to help support your growing baby. It is recommended that you consume:  150 additional calories each day during your first trimester.  300 additional calories each day during your second trimester.  300 additional calories each day during your third trimester.  Eating a healthy, well-balanced diet is very important for your health and for your baby's health. You also have a higher need for some vitamins and minerals, such as folic acid, calcium, iron, and vitamin D. What do I need to know about eating during pregnancy?  Do not try to lose weight   or go on a diet during pregnancy.  Choose healthy, nutritious foods. Choose  of a sandwich with a glass of milk instead of a candy bar or a high-calorie sugar-sweetened beverage.  Limit your overall intake of foods that have "empty calories." These are foods that have little nutritional value, such as sweets, desserts, candies, sugar-sweetened beverages, and fried foods.  Eat a  variety of foods, especially fruits and vegetables.  Take a prenatal vitamin to help meet the additional needs during pregnancy, specifically for folic acid, iron, calcium, and vitamin D.  Remember to stay active. Ask your health care provider for exercise recommendations that are specific to you.  Practice good food safety and cleanliness, such as washing your hands before you eat and after you prepare raw meat. This helps to prevent foodborne illnesses, such as listeriosis, that can be very dangerous for your baby. Ask your health care provider for more information about listeriosis. What does 150 extra calories look like? Healthy options for an additional 150 calories each day could be any of the following:  Plain low-fat yogurt (6-8 oz) with  cup of berries.  1 apple with 2 teaspoons of peanut butter.  Cut-up vegetables with  cup of hummus.  Low-fat chocolate milk (8 oz or 1 cup).  1 string cheese with 1 medium orange.   of a peanut butter and jelly sandwich on whole-wheat bread (1 tsp of peanut butter).  For 300 calories, you could eat two of those healthy options each day. What is a healthy amount of weight to gain? The recommended amount of weight for you to gain is based on your pre-pregnancy BMI. If your pre-pregnancy BMI was:  Less than 18 (underweight), you should gain 28-40 lb.  18-24.9 (normal), you should gain 25-35 lb.  25-29.9 (overweight), you should gain 15-25 lb.  Greater than 30 (obese), you should gain 11-20 lb.  What if I am having twins or multiples? Generally, pregnant women who will be having twins or multiples may need to increase their daily calories by 300-600 calories each day. The recommended range for total weight gain is 25-54 lb, depending on your pre-pregnancy BMI. Talk with your health care provider for specific guidance about additional nutritional needs, weight gain, and exercise during your pregnancy. What foods can I eat? Grains Any  grains. Try to choose whole grains, such as whole-wheat bread, oatmeal, or brown rice. Vegetables Any vegetables. Try to eat a variety of colors and types of vegetables to get a full range of vitamins and minerals. Remember to wash your vegetables well before eating. Fruits Any fruits. Try to eat a variety of colors and types of fruit to get a full range of vitamins and minerals. Remember to wash your fruits well before eating. Meats and Other Protein Sources Lean meats, including chicken, turkey, fish, and lean cuts of beef, veal, or pork. Make sure that all meats are cooked to "well done." Tofu. Tempeh. Beans. Eggs. Peanut butter and other nut butters. Seafood, such as shrimp, crab, and lobster. If you choose fish, select types that are higher in omega-3 fatty acids, including salmon, herring, mussels, trout, sardines, and pollock. Make sure that all meats are cooked to food-safe temperatures. Dairy Pasteurized milk and milk alternatives. Pasteurized yogurt and pasteurized cheese. Cottage cheese. Sour cream. Beverages Water. Juices that contain 100% fruit juice or vegetable juice. Caffeine-free teas and decaffeinated coffee. Drinks that contain caffeine are okay to drink, but it is better to avoid caffeine. Keep your total caffeine   intake to less than 200 mg each day (12 oz of coffee, tea, or soda) or as directed by your health care provider. Condiments Any pasteurized condiments. Sweets and Desserts Any sweets and desserts. Fats and Oils Any fats and oils. The items listed above may not be a complete list of recommended foods or beverages. Contact your dietitian for more options. What foods are not recommended? Vegetables Unpasteurized (raw) vegetable juices. Fruits Unpasteurized (raw) fruit juices. Meats and Other Protein Sources Cured meats that have nitrates, such as bacon, salami, and hotdogs. Luncheon meats, bologna, or other deli meats (unless they are reheated until they are  steaming hot). Refrigerated pate, meat spreads from a meat counter, smoked seafood that is found in the refrigerated section of a store. Raw fish, such as sushi or sashimi. High mercury content fish, such as tilefish, shark, swordfish, and king mackerel. Raw meats, such as tuna or beef tartare. Undercooked meats and poultry. Make sure that all meats are cooked to food-safe temperatures. Dairy Unpasteurized (raw) milk and any foods that have raw milk in them. Soft cheeses, such as feta, queso blanco, queso fresco, Brie, Camembert cheeses, blue-veined cheeses, and Panela cheese (unless it is made with pasteurized milk, which must be stated on the label). Beverages Alcohol. Sugar-sweetened beverages, such as sodas, teas, or energy drinks. Condiments Homemade fermented foods and drinks, such as pickles, sauerkraut, or kombucha drinks. (Store-bought pasteurized versions of these are okay.) Other Salads that are made in the store, such as ham salad, chicken salad, egg salad, tuna salad, and seafood salad. The items listed above may not be a complete list of foods and beverages to avoid. Contact your dietitian for more information. This information is not intended to replace advice given to you by your health care provider. Make sure you discuss any questions you have with your health care provider. Document Released: 07/17/2014 Document Revised: 03/09/2016 Document Reviewed: 03/17/2014 [employer] Interactive Patient Education  2018 [employer] Prenatal Care WHAT IS PRENATAL CARE? Prenatal care is the process of caring for a pregnant woman before she gives birth. Prenatal care makes sure that she and her baby remain as healthy as possible throughout pregnancy. Prenatal care may be provided by a midwife, family practice health care provider, or a childbirth and pregnancy specialist (obstetrician). Prenatal care may include physical examinations, testing, treatments, and education on nutrition, lifestyle, and  social support services. WHY IS PRENATAL CARE SO IMPORTANT? Early and consistent prenatal care increases the chance that you and your baby will remain healthy throughout your pregnancy. This type of care also decreases a baby's risk of being born too early (prematurely), or being born smaller than expected (small for gestational age). Any underlying medical conditions you may have that could pose a risk during your pregnancy are discussed during prenatal care visits. You will also be monitored regularly for any new conditions that may arise during your pregnancy so they can be treated quickly and effectively. WHAT HAPPENS DURING PRENATAL CARE VISITS? Prenatal care visits may include the following: Discussion Tell your health care provider about any new signs or symptoms you have experienced since your last visit. These might include:  Nausea or vomiting.  Increased or decreased level of energy.  Difficulty sleeping.  Back or leg pain.  Weight changes.  Frequent urination.  Shortness of breath with physical activity.  Changes in your skin, such as the development of a rash or itchiness.  Vaginal discharge or bleeding.  Feelings of excitement or nervousness.  Changes in   your baby's movements.  You may want to write down any questions or topics you want to discuss with your health care provider and bring them with you to your appointment. Examination During your first prenatal care visit, you will likely have a complete physical exam. Your health care provider will often examine your vagina, cervix, and the position of your uterus, as well as check your heart, lungs, and other body systems. As your pregnancy progresses, your health care provider will measure the size of your uterus and your baby's position inside your uterus. He or she may also examine you for early signs of labor. Your prenatal visits may also include checking your blood pressure and, after about 10-12 weeks of  pregnancy, listening to your baby's heartbeat. Testing Regular testing often includes:  Urinalysis. This checks your urine for glucose, protein, or signs of infection.  Blood count. This checks the levels of white and red blood cells in your body.  Tests for sexually transmitted infections (STIs). Testing for STIs at the beginning of pregnancy is routinely done and is required in many states.  Antibody testing. You will be checked to see if you are immune to certain illnesses, such as rubella, that can affect a developing fetus.  Glucose screen. Around 24-28 weeks of pregnancy, your blood glucose level will be checked for signs of gestational diabetes. Follow-up tests may be recommended.  Group B strep. This is a bacteria that is commonly found inside a woman's vagina. This test will inform your health care provider if you need an antibiotic to reduce the amount of this bacteria in your body prior to labor and childbirth.  Ultrasound. Many pregnant women undergo an ultrasound screening around 18-20 weeks of pregnancy to evaluate the health of the fetus and check for any developmental abnormalities.  HIV (human immunodeficiency virus) testing. Early in your pregnancy, you will be screened for HIV. If you are at high risk for HIV, this test may be repeated during your third trimester of pregnancy.  You may be offered other testing based on your age, personal or family medical history, or other factors. HOW OFTEN SHOULD I PLAN TO SEE MY HEALTH CARE PROVIDER FOR PRENATAL CARE? Your prenatal care check-up schedule depends on any medical conditions you have before, or develop during, your pregnancy. If you do not have any underlying medical conditions, you will likely be seen for checkups:  Monthly, during the first 6 months of pregnancy.  Twice a month during months 7 and 8 of pregnancy.  Weekly starting in the 9th month of pregnancy and until delivery.  If you develop signs of early labor  or other concerning signs or symptoms, you may need to see your health care provider more often. Ask your health care provider what prenatal care schedule is best for you. WHAT CAN I DO TO KEEP MYSELF AND MY BABY AS HEALTHY AS POSSIBLE DURING MY PREGNANCY?  Take a prenatal vitamin containing 400 micrograms (0.4 mg) of folic acid every day. Your health care provider may also ask you to take additional vitamins such as iodine, vitamin D, iron, copper, and zinc.  Take 1500-2000 mg of calcium daily starting at your 20th week of pregnancy until you deliver your baby.  Make sure you are up to date on your vaccinations. Unless directed otherwise by your health care provider: ? You should receive a tetanus, diphtheria, and pertussis (Tdap) vaccination between the 27th and 36th week of your pregnancy, regardless of when your last Tdap immunization   occurred. This helps protect your baby from whooping cough (pertussis) after he or she is born. ? You should receive an annual inactivated influenza vaccine (IIV) to help protect you and your baby from influenza. This can be done at any point during your pregnancy.  Eat a well-rounded diet that includes: ? Fresh fruits and vegetables. ? Lean proteins. ? Calcium-rich foods such as milk, yogurt, hard cheeses, and dark, leafy greens. ? Whole grain breads.  Do noteat seafood high in mercury, including: ? Swordfish. ? Tilefish. ? Shark. ? King mackerel. ? More than 6 oz tuna per week.  Do not eat: ? Raw or undercooked meats or eggs. ? Unpasteurized foods, such as soft cheeses (brie, blue, or feta), juices, and milks. ? Lunch meats. ? Hot dogs that have not been heated until they are steaming.  Drink enough water to keep your urine clear or pale yellow. For many women, this may be 10 or more 8 oz glasses of water each day. Keeping yourself hydrated helps deliver nutrients to your baby and may prevent the start of pre-term uterine contractions.  Do not use  any tobacco products including cigarettes, chewing tobacco, or electronic cigarettes. If you need help quitting, ask your health care provider.  Do not drink beverages containing alcohol. No safe level of alcohol consumption during pregnancy has been determined.  Do not use any illegal drugs. These can harm your developing baby or cause a miscarriage.  Ask your health care provider or pharmacist before taking any prescription or over-the-counter medicines, herbs, or supplements.  Limit your caffeine intake to no more than 200 mg per day.  Exercise. Unless told otherwise by your health care provider, try to get 30 minutes of moderate exercise most days of the week. Do not  do high-impact activities, contact sports, or activities with a high risk of falling, such as horseback riding or downhill skiing.  Get plenty of rest.  Avoid anything that raises your body temperature, such as hot tubs and saunas.  If you own a cat, do not empty its litter box. Bacteria contained in cat feces can cause an infection called toxoplasmosis. This can result in serious harm to the fetus.  Stay away from chemicals such as insecticides, lead, mercury, and cleaning or paint products that contain solvents.  Do not have any X-rays taken unless medically necessary.  Take a childbirth and breastfeeding preparation class. Ask your health care provider if you need a referral or recommendation.  This information is not intended to replace advice given to you by your health care provider. Make sure you discuss any questions you have with your health care provider. Document Released: 10/05/2003 Document Revised: 03/06/2016 Document Reviewed: 12/17/2013 [employer] Interactive Patient Education  2017 [employer]  

## 2018-03-14 NOTE — Progress Notes (Signed)
NOB N&V first thing in AM

## 2018-03-15 LAB — URINE DRUG PANEL 7
Amphetamines, Urine: NEGATIVE ng/mL
BENZODIAZEPINE QUANT UR: NEGATIVE ng/mL
Barbiturate Quant, Ur: NEGATIVE ng/mL
CANNABINOID QUANT UR: NEGATIVE ng/mL
COCAINE (METAB.): NEGATIVE ng/mL
OPIATE QUANT UR: NEGATIVE ng/mL
PCP QUANT UR: NEGATIVE ng/mL

## 2018-03-16 LAB — CHLAMYDIA/GONOCOCCUS/TRICHOMONAS, NAA
CHLAMYDIA BY NAA: NEGATIVE
Gonococcus by NAA: NEGATIVE
Trich vag by NAA: NEGATIVE

## 2018-03-16 LAB — URINE CULTURE: ORGANISM ID, BACTERIA: NO GROWTH

## 2018-03-29 ENCOUNTER — Ambulatory Visit (INDEPENDENT_AMBULATORY_CARE_PROVIDER_SITE_OTHER): Payer: Medicaid Other

## 2018-03-29 ENCOUNTER — Encounter: Payer: Self-pay | Admitting: Obstetrics and Gynecology

## 2018-03-29 ENCOUNTER — Ambulatory Visit (INDEPENDENT_AMBULATORY_CARE_PROVIDER_SITE_OTHER): Payer: Medicaid Other | Admitting: Obstetrics and Gynecology

## 2018-03-29 VITALS — BP 116/70 | Wt 175.0 lb

## 2018-03-29 DIAGNOSIS — Z3A10 10 weeks gestation of pregnancy: Secondary | ICD-10-CM

## 2018-03-29 DIAGNOSIS — Z3480 Encounter for supervision of other normal pregnancy, unspecified trimester: Secondary | ICD-10-CM

## 2018-03-29 DIAGNOSIS — Z3483 Encounter for supervision of other normal pregnancy, third trimester: Secondary | ICD-10-CM

## 2018-03-29 DIAGNOSIS — Z113 Encounter for screening for infections with a predominantly sexual mode of transmission: Secondary | ICD-10-CM

## 2018-03-29 DIAGNOSIS — Z1379 Encounter for other screening for genetic and chromosomal anomalies: Secondary | ICD-10-CM

## 2018-03-29 LAB — OB RESULTS CONSOLE VARICELLA ZOSTER ANTIBODY, IGG: Varicella: IMMUNE

## 2018-03-29 NOTE — Progress Notes (Signed)
Routine Prenatal Care Visit  Subjective  Natalie Pierce is a 22 y.o. G2P1001 at 6656w4d being seen today for ongoing prenatal care.  She is currently monitored for the following issues for this low-risk pregnancy and has Supervision of other normal pregnancy, antepartum on their problem list.  ----------------------------------------------------------------------------------- Patient reports no complaints.    .  .   . Denies leaking of fluid.  ----------------------------------------------------------------------------------- The following portions of the patient's history were reviewed and updated as appropriate: allergies, current medications, past family history, past medical history, past social history, past surgical history and problem list. Problem list updated.   Objective  Blood pressure 116/70, weight 175 lb (79.4 kg), last menstrual period 01/14/2018. Pregravid weight 170 lb (77.1 kg) Total Weight Gain 5 lb (2.268 kg) Urinalysis: Urine Protein: Trace Urine Glucose: Negative  Fetal Status:           General:  Alert, oriented and cooperative. Patient is in no acute distress.  Skin: Skin is warm and dry. No rash noted.   Cardiovascular: Normal heart rate noted  Respiratory: Normal respiratory effort, no problems with respiration noted  Abdomen: Soft, gravid, appropriate for gestational age.       Pelvic:  Cervical exam deferred        Extremities: Normal range of motion.     Mental Status: Normal mood and affect. Normal behavior. Normal judgment and thought content.   [place] Ob Comp Less 14 Wks  Result Date: 03/29/2018 ULTRASOUND REPORT Patient Name: Sherie Donmoni D Lamison DOB: 11/11/1995 MRN: [id number] Location: [facility] Date of Service: 03/29/2018 Indications: pregnancy dating Findings: Singleton intrauterine pregnancy is visualized with a CRL consistent with 4556w4d gestation, giving an (U/S) EDD of 10/21/2018. The (U/S) EDD is consistent with the clinically established EDD of  10/21/2018. FHR: 186 bpm CRL measurement: 36.3 mm Yolk sac is not visualized and early anatomy is normal. Amnion: not visualized Right Ovary is not identified Left Ovary is noidentified Corpus luteal cyst:  is not visualized Survey of the adnexa demonstrates no adnexal masses. There is no free peritoneal fluid in the cul de sac. Impression: 1. 6356w4d Viable Singleton Intrauterine pregnancy by U/S. 2. (U/S) EDD is consistent with Clinically established EDD of 10/21/2018. Recommendations: 1.Clinical correlation with the patient's History and Physical Exam. Willette AlmaKristen Priestley, RDMS, RVT There is a viable singleton gestation.  Detailed evaluation of the fetal anatomy is precluded by early gestational age.  It must be noted that a normal ultrasound particular at this early gestational age is unable to rule out fetal aneuploidy, risk of first trimester miscarriage, or anatomic birth defects. Thomasene MohairStephen Geoff Dacanay, MD, Merlinda FrederickFACOG [facility], [facility] 03/29/2018 3:54 PM      Assessment   21 y.o. G2P1001 at 1956w4d by  10/21/2018, by Last Menstrual Period presenting for routine prenatal visit  Plan   Pregnancy#2 Problems (from 01/14/18 to present)    Problem Noted Resolved   Supervision of other normal pregnancy, antepartum 03/14/2018 by Tresea MallGledhill, Jane, CNM No   Overview Signed 03/14/2018 10:23 AM by Tresea MallGledhill, Jane, CNM    [facility]inic [facility] Prenatal Labs  Dating  Blood type:     Genetic Screen 1 Screen:    AFP:     Quad:     NIPS: Antibody:   Anatomic US  Rubella:   Varicella: @VZVIGG @  GTT Early:               Third trimester:  RPR:     Rhogam  HBsAg:  TDaP vaccine                       Flu Shot: HIV:     Baby Food  Breast                               GBS:   Contraception  Pap:  CBB     CS/VBAC NA   Support Person  Boyfriend Trayvance          Preterm labor symptoms and general obstetric precautions including but not limited to vaginal bleeding, contractions, leaking of fluid and fetal  movement were reviewed in detail with the patient. Please refer to After Visit Summary for other counseling recommendations.   - NOB labs today - u/s confirms EDD - genetic screening with carrier testing today  Return in about 1 month (around 04/26/2018) for Routine Prenatal Appointment.  Thomasene Mohair, MD, [facility], [facility] 03/29/2018 4:09 PM

## 2018-04-04 ENCOUNTER — Telehealth: Payer: Self-pay

## 2018-04-04 LAB — MATERNIT 21 PLUS CORE, BLOOD
CHROMOSOME 21: NEGATIVE
Chromosome 13: NEGATIVE
Chromosome 18: NEGATIVE
Y Chromosome: NOT DETECTED

## 2018-04-04 NOTE — Telephone Encounter (Signed)
Pt wants to know the sex of her baby.  [phone] Pt aware it's a girl.

## 2018-04-08 LAB — RPR+RH+ABO+RUB AB+AB SCR+CB...
ANTIBODY SCREEN: NEGATIVE
HEMATOCRIT: 35.3 % (ref 34.0–46.6)
HIV Screen 4th Generation wRfx: NONREACTIVE
Hemoglobin: 12.3 g/dL (ref 11.1–15.9)
Hepatitis B Surface Ag: NEGATIVE
MCH: 29.6 pg (ref 26.6–33.0)
MCHC: 34.8 g/dL (ref 31.5–35.7)
MCV: 85 fL (ref 79–97)
Platelets: 290 10*3/uL (ref 150–450)
RBC: 4.16 x10E6/uL (ref 3.77–5.28)
RDW: 15.6 % — AB (ref 12.3–15.4)
RH TYPE: POSITIVE
RPR: NONREACTIVE
Rubella Antibodies, IGG: 7.37 index (ref >0.99)
Varicella zoster IgG: 2544 index (ref >165)
WBC: 7.1 10*3/uL (ref 3.4–10.8)

## 2018-04-08 LAB — HEMOGLOBINOPATHY EVALUATION
HEMOGLOBIN F QUANTITATION: 0 % (ref 0.0–2.0)
HGB A: 97.7 % (ref 96.4–98.8)
HGB C: 0 %
HGB S: 0 %
HGB VARIANT: 0 %
Hemoglobin A2 Quantitation: 2.3 % (ref 1.8–3.2)

## 2018-04-08 LAB — INHERITEST CORE(CF97,SMA,FRAX)

## 2018-04-26 ENCOUNTER — Ambulatory Visit (INDEPENDENT_AMBULATORY_CARE_PROVIDER_SITE_OTHER): Payer: Medicaid Other | Admitting: Obstetrics and Gynecology

## 2018-04-26 ENCOUNTER — Encounter: Payer: Self-pay | Admitting: Obstetrics and Gynecology

## 2018-04-26 VITALS — BP 108/64 | Wt 180.0 lb

## 2018-04-26 DIAGNOSIS — Z3A14 14 weeks gestation of pregnancy: Secondary | ICD-10-CM

## 2018-04-26 DIAGNOSIS — Z3482 Encounter for supervision of other normal pregnancy, second trimester: Secondary | ICD-10-CM

## 2018-04-26 DIAGNOSIS — Z3480 Encounter for supervision of other normal pregnancy, unspecified trimester: Secondary | ICD-10-CM

## 2018-04-26 NOTE — Progress Notes (Incomplete)
  Routine Prenatal Care Visit  Subjective  Natalie Pierce is a 22 y.o. G2P1001 at 6571w4d being seen today for ongoing prenatal care.  She is currently monitored for the following issues for this {Blank single:19197::"high-risk","low-risk"} pregnancy and has Supervision of other normal pregnancy, antepartum on their problem list.  ----------------------------------------------------------------------------------- Patient reports {sx:14538}.    . Vag. Bleeding: None.   . Denies leaking of fluid.  ----------------------------------------------------------------------------------- The following portions of the patient's history were reviewed and updated as appropriate: allergies, current medications, past family history, past medical history, past social history, past surgical history and problem list. Problem list updated.   Objective  Blood pressure 108/64, weight 180 lb (81.6 kg), last menstrual period 01/14/2018. Pregravid weight 170 lb (77.1 kg) Total Weight Gain 10 lb (4.536 kg) Urinalysis: Urine Protein: Trace Urine Glucose: Negative  Fetal Status: Fetal Heart Rate (bpm): 165         General:  Alert, oriented and cooperative. Patient is in no acute distress.  Skin: Skin is warm and dry. No rash noted.   Cardiovascular: Normal heart rate noted  Respiratory: Normal respiratory effort, no problems with respiration noted  Abdomen: Soft, gravid, appropriate for gestational age.       Pelvic:  {Blank single:19197::"Cervical exam performed","Cervical exam deferred"}        Extremities: Normal range of motion.     Mental Status: Normal mood and affect. Normal behavior. Normal judgment and thought content.   Assessment   21 y.o. G2P1001 at 7071w4d by  10/21/2018, by Last Menstrual Period presenting for {Blank single:19197::"routine","work-in"} prenatal visit  Plan   Pregnancy#2 Problems (from 01/14/18 to present)    Problem Noted Resolved   Supervision of other normal pregnancy, antepartum  03/14/2018 by Tresea MallGledhill, Jane, CNM No   Overview Signed 03/14/2018 10:23 AM by Tresea MallGledhill, Jane, CNM    Clinic [facility] Prenatal Labs  Dating  Blood type:     Genetic Screen 1 Screen:    AFP:     Quad:     NIPS: Antibody:   Anatomic US  Rubella:   Varicella: @VZVIGG @  GTT Early:               Third trimester:  RPR:     Rhogam  HBsAg:     TDaP vaccine                       Flu Shot: HIV:     Baby Food  Breast                               GBS:   Contraception  Pap:  CBB     CS/VBAC NA   Support Person  Boyfriend Trayvance               {Blank single:19197::"Term","Preterm"} labor symptoms and general obstetric precautions including but not limited to vaginal bleeding, contractions, leaking of fluid and fetal movement were reviewed in detail with the patient. Please refer to After Visit Summary for other counseling recommendations.   Return in about 1 month (around 05/24/2018) for schedule anatomy u/s and routine prenatal after.  Thomasene MohairStephen Zyheir Daft, MD, Merlinda FrederickFACOG [facility], [facility] 04/26/2018 3:40 PM

## 2018-04-26 NOTE — Progress Notes (Signed)
  Routine Prenatal Care Visit  Subjective  Natalie Pierce is a 22 y.o. G2P1001 at 2956w4d being seen today for ongoing prenatal care.  She is currently monitored for the following issues for this low-risk pregnancy and has Supervision of other normal pregnancy, antepartum on their problem list.  ----------------------------------------------------------------------------------- Patient reports no complaints.    . Vag. Bleeding: None.   . Denies leaking of fluid.  ----------------------------------------------------------------------------------- The following portions of the patient's history were reviewed and updated as appropriate: allergies, current medications, past family history, past medical history, past social history, past surgical history and problem list. Problem list updated.   Objective  Blood pressure 108/64, weight 180 lb (81.6 kg), last menstrual period 01/14/2018. Pregravid weight 170 lb (77.1 kg) Total Weight Gain 10 lb (4.536 kg) Urinalysis: Urine Protein: Trace Urine Glucose: Negative  Fetal Status: Fetal Heart Rate (bpm): 165         General:  Alert, oriented and cooperative. Patient is in no acute distress.  Skin: Skin is warm and dry. No rash noted.   Cardiovascular: Normal heart rate noted  Respiratory: Normal respiratory effort, no problems with respiration noted  Abdomen: Soft, gravid, appropriate for gestational age.       Pelvic:  Cervical exam deferred        Extremities: Normal range of motion.     Mental Status: Normal mood and affect. Normal behavior. Normal judgment and thought content.   Assessment   21 y.o. G2P1001 at 4156w4d by  10/21/2018, by Last Menstrual Period presenting for routine prenatal visit  Plan   Pregnancy#2 Problems (from 01/14/18 to present)    Problem Noted Resolved   Supervision of other normal pregnancy, antepartum 03/14/2018 by Tresea MallGledhill, Jane, CNM No   Overview Signed 03/14/2018 10:23 AM by Tresea MallGledhill, Jane, CNM    [facility]inic [facility]  Prenatal Labs  Dating  Blood type:     Genetic Screen 1 Screen:    AFP:     Quad:     NIPS: Antibody:   Anatomic US  Rubella:   Varicella: @VZVIGG @  GTT Early:               Third trimester:  RPR:     Rhogam  HBsAg:     TDaP vaccine                       Flu Shot: HIV:     Baby Food  Breast                               GBS:   Contraception  Pap:  CBB     CS/VBAC NA   Support Person  Boyfriend Trayvance               Preterm labor symptoms and general obstetric precautions including but not limited to vaginal bleeding, contractions, leaking of fluid and fetal movement were reviewed in detail with the patient. Please refer to After Visit Summary for other counseling recommendations.   Return in about 1 month (around 05/24/2018) for schedule anatomy u/s and routine prenatal after.  Thomasene MohairStephen Charisma Charlot, MD, Merlinda FrederickFACOG [facility], [facility] 04/26/2018 3:40 PM

## 2018-05-22 ENCOUNTER — Ambulatory Visit (INDEPENDENT_AMBULATORY_CARE_PROVIDER_SITE_OTHER): Payer: Medicaid Other

## 2018-05-22 ENCOUNTER — Encounter: Payer: Self-pay | Admitting: Advanced Practice Midwife

## 2018-05-22 ENCOUNTER — Ambulatory Visit (INDEPENDENT_AMBULATORY_CARE_PROVIDER_SITE_OTHER): Payer: Medicaid Other | Admitting: Advanced Practice Midwife

## 2018-05-22 VITALS — BP 112/76 | Wt 183.0 lb

## 2018-05-22 DIAGNOSIS — Z3480 Encounter for supervision of other normal pregnancy, unspecified trimester: Secondary | ICD-10-CM

## 2018-05-22 DIAGNOSIS — Z3A18 18 weeks gestation of pregnancy: Secondary | ICD-10-CM

## 2018-05-22 DIAGNOSIS — Z3482 Encounter for supervision of other normal pregnancy, second trimester: Secondary | ICD-10-CM

## 2018-05-22 DIAGNOSIS — Z363 Encounter for antenatal screening for malformations: Secondary | ICD-10-CM

## 2018-05-22 NOTE — Progress Notes (Signed)
  Routine Prenatal Care Visit  Subjective  Natalie Pierce is a 22 y.o. G2P1001 at 58107w2d being seen today for ongoing prenatal care.  She is currently monitored for the following issues for this low-risk pregnancy and has Supervision of other normal pregnancy, antepartum on their problem list.  ----------------------------------------------------------------------------------- Patient reports no complaints.    . Vag. Bleeding: None.  Movement: Present. Denies leaking of fluid.  ----------------------------------------------------------------------------------- The following portions of the patient's history were reviewed and updated as appropriate: allergies, current medications, past family history, past medical history, past social history, past surgical history and problem list. Problem list updated.   Objective  Blood pressure 112/76, weight 183 lb (83 kg), last menstrual period 01/14/2018. Pregravid weight 170 lb (77.1 kg) Total Weight Gain 13 lb (5.897 kg) Urinalysis: Urine Protein: Negative Urine Glucose: Negative  Fetal Status: Fetal Heart Rate (bpm): 153   Movement: Present   Presentation: Vertex  Anatomy scan today is incomplete for posterior fossa of brain and otherwise normal  General:  Alert, oriented and cooperative. Patient is in no acute distress.  Skin: Skin is warm and dry. No rash noted.   Cardiovascular: Normal heart rate noted  Respiratory: Normal respiratory effort, no problems with respiration noted  Abdomen: Soft, gravid, appropriate for gestational age. Pain/Pressure: Absent     Pelvic:  Cervical exam deferred        Extremities: Normal range of motion.     Mental Status: Normal mood and affect. Normal behavior. Normal judgment and thought content.   Assessment   21 y.o. G2P1001 at 21107w2d by  10/21/2018, by Last Menstrual Period presenting for routine prenatal visit  Plan   Pregnancy#2 Problems (from 01/14/18 to present)    Problem Noted Resolved   Supervision of other normal pregnancy, antepartum 03/14/2018 by Tresea MallGledhill, Karver Fadden, CNM No   Overview Signed 03/14/2018 10:23 AM by Tresea MallGledhill, Shamaine Mulkern, CNM    [facility]inic [facility] Prenatal Labs  Dating  Blood type:     Genetic Screen 1 Screen:    AFP:     Quad:     NIPS: Antibody:   Anatomic US  Rubella:   Varicella: @VZVIGG @  GTT Early:               Third trimester:  RPR:     Rhogam  HBsAg:     TDaP vaccine                       Flu Shot: HIV:     Baby Food  Breast                               GBS:   Contraception  Pap:  CBB     CS/VBAC NA   Support Person  Boyfriend Natalie Pierce               Preterm labor symptoms and general obstetric precautions including but not limited to vaginal bleeding, contractions, leaking of fluid and fetal movement were reviewed in detail with the patient.   Return in about 1 month (around 06/19/2018) for f/u anatomy and rob.  Tresea MallJane Robbye Dede, CNM 05/22/2018 4:50 PM

## 2018-06-19 ENCOUNTER — Other Ambulatory Visit: Payer: Self-pay | Admitting: Obstetrics and Gynecology

## 2018-06-19 ENCOUNTER — Other Ambulatory Visit: Payer: Medicaid Other

## 2018-06-19 ENCOUNTER — Encounter: Payer: Medicaid Other | Admitting: Obstetrics and Gynecology

## 2018-06-19 ENCOUNTER — Telehealth: Payer: Self-pay | Admitting: Obstetrics and Gynecology

## 2018-06-19 DIAGNOSIS — Z3482 Encounter for supervision of other normal pregnancy, second trimester: Secondary | ICD-10-CM

## 2018-06-19 NOTE — Telephone Encounter (Signed)
Please advise. Thank you

## 2018-06-19 NOTE — Telephone Encounter (Signed)
Ultrasound reordered.

## 2018-06-19 NOTE — Telephone Encounter (Signed)
Pt was late to her appointment and had to reschedule. Please put new order in for u/s. Thank you.

## 2018-06-28 ENCOUNTER — Encounter: Payer: Self-pay | Admitting: Obstetrics and Gynecology

## 2018-06-28 ENCOUNTER — Ambulatory Visit (INDEPENDENT_AMBULATORY_CARE_PROVIDER_SITE_OTHER): Payer: Medicaid Other

## 2018-06-28 ENCOUNTER — Ambulatory Visit (INDEPENDENT_AMBULATORY_CARE_PROVIDER_SITE_OTHER): Payer: Medicaid Other | Admitting: Obstetrics and Gynecology

## 2018-06-28 VITALS — BP 118/70 | Wt 185.0 lb

## 2018-06-28 DIAGNOSIS — Z3480 Encounter for supervision of other normal pregnancy, unspecified trimester: Secondary | ICD-10-CM

## 2018-06-28 DIAGNOSIS — Z3A23 23 weeks gestation of pregnancy: Secondary | ICD-10-CM

## 2018-06-28 DIAGNOSIS — Z3482 Encounter for supervision of other normal pregnancy, second trimester: Secondary | ICD-10-CM | POA: Diagnosis not present

## 2018-06-28 LAB — POCT URINALYSIS DIPSTICK OB: GLUCOSE, UA: NEGATIVE

## 2018-06-28 NOTE — Progress Notes (Signed)
    Routine Prenatal Care Visit  Subjective  Natalie Pierce is a 22 y.o. G2P1001 at 1441w4d being seen today for ongoing prenatal care.  She is currently monitored for the following issues for this low-risk pregnancy and has Supervision of other normal pregnancy, antepartum on their problem list.  ----------------------------------------------------------------------------------- Patient reports no complaints.  Some reflux, managing with TUMS. Contractions: Not present. Vag. Bleeding: None.  Movement: Present. Denies leaking of fluid.  ----------------------------------------------------------------------------------- The following portions of the patient's history were reviewed and updated as appropriate: allergies, current medications, past family history, past medical history, past social history, past surgical history and problem list. Problem list updated.   Objective  Blood pressure 118/70, weight 185 lb (83.9 kg), last menstrual period 01/14/2018. Pregravid weight 170 lb (77.1 kg) Total Weight Gain 15 lb (6.804 kg) Urinalysis:      Fetal Status: Fetal Heart Rate (bpm): 140   Movement: Present     General:  Alert, oriented and cooperative. Patient is in no acute distress.  Skin: Skin is warm and dry. No rash noted.   Cardiovascular: Normal heart rate noted  Respiratory: Normal respiratory effort, no problems with respiration noted  Abdomen: Soft, gravid, appropriate for gestational age. Pain/Pressure: Absent     Pelvic:  Cervical exam deferred        Extremities: Normal range of motion.     ental Status: Normal mood and affect. Normal behavior. Normal judgment and thought content.     Assessment   22 y.o. G2P1001 at 1141w4d by  10/21/2018, by Last Menstrual Period presenting for routine prenatal visit  Plan   Pregnancy#2 Problems (from 01/14/18 to present)    Problem Noted Resolved   Supervision of other normal pregnancy, antepartum 03/14/2018 by Tresea MallGledhill, Jane, CNM No   Overview Addendum 06/28/2018  4:06 PM by Natale MilchSchuman, Labrittany Wechter R, MD    Clinic [facility] Prenatal Labs  Dating  LMP =10 week US Blood type: O/Positive/-- (06/14 1640)   Genetic Screen     NIPS: normal XX Antibody:Negative (06/14 1640)  Anatomic US complete Rubella: 7.37 (06/14 1640) Varicella: Immune  GTT Third trimester:  RPR: Non Reactive (06/14 1640)   Rhogam Not needed HBsAg: Negative (06/14 1640)   TDaP vaccine                        Flu Shot: Declined HIV: Non Reactive (06/14 1640)   Baby Food  Breast                               GBS:   Contraception  Pap: NIL  CBB     CS/VBAC NA   Support Person  Boyfriend Trayvance               Gestational age appropriate obstetric precautions including but not limited to vaginal bleeding, contractions, leaking of fluid and fetal movement were reviewed in detail with the patient.    Given information on prenatal classes with [facility].  Given information on volunteer doula program at the hospital.  Discussed breast feeding. Patient is planning on breast feeding. Patient was given resources and lactation consultation was advised.  Return in about 4 weeks (around 07/26/2018) for ROB and 28 week labs.  Adelene Idlerhristanna Anivea Velasques MD [facility], [facility] 06/28/18 4:06 PM

## 2018-06-28 NOTE — Progress Notes (Signed)
ROB C/o some nausea  Declines flu shot

## 2018-07-26 ENCOUNTER — Other Ambulatory Visit: Payer: Medicaid Other

## 2018-07-26 ENCOUNTER — Encounter: Payer: Self-pay | Admitting: Advanced Practice Midwife

## 2018-07-26 ENCOUNTER — Ambulatory Visit (INDEPENDENT_AMBULATORY_CARE_PROVIDER_SITE_OTHER): Payer: Medicaid Other | Admitting: Advanced Practice Midwife

## 2018-07-26 VITALS — BP 122/78 | Wt 194.0 lb

## 2018-07-26 DIAGNOSIS — Z3A27 27 weeks gestation of pregnancy: Secondary | ICD-10-CM

## 2018-07-26 DIAGNOSIS — Z3480 Encounter for supervision of other normal pregnancy, unspecified trimester: Secondary | ICD-10-CM | POA: Diagnosis not present

## 2018-07-26 DIAGNOSIS — Z3482 Encounter for supervision of other normal pregnancy, second trimester: Secondary | ICD-10-CM

## 2018-07-26 NOTE — Progress Notes (Signed)
ROB and 28 week labs- no concens

## 2018-07-26 NOTE — Patient Instructions (Signed)
Third Trimester of Pregnancy The third trimester is from week 28 through week 40 (months 7 through 9). The third trimester is a time when the unborn baby (fetus) is growing rapidly. At the end of the ninth month, the fetus is about 20 inches in length and weighs 6-10 pounds. Body changes during your third trimester Your body will continue to go through many changes during pregnancy. The changes vary from woman to woman. During the third trimester:  Your weight will continue to increase. You can expect to gain 25-35 pounds (11-16 kg) by the end of the pregnancy.  You may begin to get stretch marks on your hips, abdomen, and breasts.  You may urinate more often because the fetus is moving lower into your pelvis and pressing on your bladder.  You may develop or continue to have heartburn. This is caused by increased hormones that slow down muscles in the digestive tract.  You may develop or continue to have constipation because increased hormones slow digestion and cause the muscles that push waste through your intestines to relax.  You may develop hemorrhoids. These are swollen veins (varicose veins) in the rectum that can itch or be painful.  You may develop swollen, bulging veins (varicose veins) in your legs.  You may have increased body aches in the pelvis, back, or thighs. This is due to weight gain and increased hormones that are relaxing your joints.  You may have changes in your hair. These can include thickening of your hair, rapid growth, and changes in texture. Some women also have hair loss during or after pregnancy, or hair that feels dry or thin. Your hair will most likely return to normal after your baby is born.  Your breasts will continue to grow and they will continue to become tender. A yellow fluid (colostrum) may leak from your breasts. This is the first milk you are producing for your baby.  Your belly button may stick out.  You may notice more swelling in your hands,  face, or ankles.  You may have increased tingling or numbness in your hands, arms, and legs. The skin on your belly may also feel numb.  You may feel short of breath because of your expanding uterus.  You may have more problems sleeping. This can be caused by the size of your belly, increased need to urinate, and an increase in your body's metabolism.  You may notice the fetus "dropping," or moving lower in your abdomen (lightening).  You may have increased vaginal discharge.  You may notice your joints feel loose and you may have pain around your pelvic bone.  What to expect at prenatal visits You will have prenatal exams every 2 weeks until week 36. Then you will have weekly prenatal exams. During a routine prenatal visit:  You will be weighed to make sure you and the baby are growing normally.  Your blood pressure will be taken.  Your abdomen will be measured to track your baby's growth.  The fetal heartbeat will be listened to.  Any test results from the previous visit will be discussed.  You may have a cervical check near your due date to see if your cervix has softened or thinned (effaced).  You will be tested for Group B streptococcus. This happens between 35 and 37 weeks.  Your health care provider may ask you:  What your birth plan is.  How you are feeling.  If you are feeling the baby move.  If you have had   any abnormal symptoms, such as leaking fluid, bleeding, severe headaches, or abdominal cramping.  If you are using any tobacco products, including cigarettes, chewing tobacco, and electronic cigarettes.  If you have any questions.  Other tests or screenings that may be performed during your third trimester include:  Blood tests that check for low iron levels (anemia).  Fetal testing to check the health, activity level, and growth of the fetus. Testing is done if you have certain medical conditions or if there are problems during the  pregnancy.  Nonstress test (NST). This test checks the health of your baby to make sure there are no signs of problems, such as the baby not getting enough oxygen. During this test, a belt is placed around your belly. The baby is made to move, and its heart rate is monitored during movement.  What is false labor? False labor is a condition in which you feel small, irregular tightenings of the muscles in the womb (contractions) that usually go away with rest, changing position, or drinking water. These are called Braxton Hicks contractions. Contractions may last for hours, days, or even weeks before true labor sets in. If contractions come at regular intervals, become more frequent, increase in intensity, or become painful, you should see your health care provider. What are the signs of labor?  Abdominal cramps.  Regular contractions that start at 10 minutes apart and become stronger and more frequent with time.  Contractions that start on the top of the uterus and spread down to the lower abdomen and back.  Increased pelvic pressure and dull back pain.  A watery or bloody mucus discharge that comes from the vagina.  Leaking of amniotic fluid. This is also known as your "water breaking." It could be a slow trickle or a gush. Let your health care provider know if it has a color or strange odor. If you have any of these signs, call your health care provider right away, even if it is before your due date. Follow these instructions at home: Medicines  Follow your health care provider's instructions regarding medicine use. Specific medicines may be either safe or unsafe to take during pregnancy.  Take a prenatal vitamin that contains at least 600 micrograms (mcg) of folic acid.  If you develop constipation, try taking a stool softener if your health care provider approves. Eating and drinking  Eat a balanced diet that includes fresh fruits and vegetables, whole grains, good sources of protein  such as meat, eggs, or tofu, and low-fat dairy. Your health care provider will help you determine the amount of weight gain that is right for you.  Avoid raw meat and uncooked cheese. These carry germs that can cause birth defects in the baby.  If you have low calcium intake from food, talk to your health care provider about whether you should take a daily calcium supplement.  Eat four or five small meals rather than three large meals a day.  Limit foods that are high in fat and processed sugars, such as fried and sweet foods.  To prevent constipation: ? Drink enough fluid to keep your urine clear or pale yellow. ? Eat foods that are high in fiber, such as fresh fruits and vegetables, whole grains, and beans. Activity  Exercise only as directed by your health care provider. Most women can continue their usual exercise routine during pregnancy. Try to exercise for 30 minutes at least 5 days a week. Stop exercising if you experience uterine contractions.  Avoid heavy   lifting.  Do not exercise in extreme heat or humidity, or at high altitudes.  Wear low-heel, comfortable shoes.  Practice good posture.  You may continue to have sex unless your health care provider tells you otherwise. Relieving pain and discomfort  Take frequent breaks and rest with your legs elevated if you have leg cramps or low back pain.  Take warm sitz baths to soothe any pain or discomfort caused by hemorrhoids. Use hemorrhoid cream if your health care provider approves.  Wear a good support bra to prevent discomfort from breast tenderness.  If you develop varicose veins: ? Wear support pantyhose or compression stockings as told by your healthcare provider. ? Elevate your feet for 15 minutes, 3-4 times a day. Prenatal care  Write down your questions. Take them to your prenatal visits.  Keep all your prenatal visits as told by your health care provider. This is important. Safety  Wear your seat belt at  all times when driving.  Make a list of emergency phone numbers, including numbers for family, friends, the hospital, and police and fire departments. General instructions  Avoid cat litter boxes and soil used by cats. These carry germs that can cause birth defects in the baby. If you have a cat, ask someone to clean the litter box for you.  Do not travel far distances unless it is absolutely necessary and only with the approval of your health care provider.  Do not use hot tubs, steam rooms, or saunas.  Do not drink alcohol.  Do not use any products that contain nicotine or tobacco, such as cigarettes and e-cigarettes. If you need help quitting, ask your health care provider.  Do not use any medicinal herbs or unprescribed drugs. These chemicals affect the formation and growth of the baby.  Do not douche or use tampons or scented sanitary pads.  Do not cross your legs for long periods of time.  To prepare for the arrival of your baby: ? Take prenatal classes to understand, practice, and ask questions about labor and delivery. ? Make a trial run to the hospital. ? Visit the hospital and tour the maternity area. ? Arrange for maternity or paternity leave through employers. ? Arrange for family and friends to take care of pets while you are in the hospital. ? Purchase a rear-facing car seat and make sure you know how to install it in your car. ? Pack your hospital bag. ? Prepare the baby's nursery. Make sure to remove all pillows and stuffed animals from the baby's crib to prevent suffocation.  Visit your dentist if you have not gone during your pregnancy. Use a soft toothbrush to brush your teeth and be gentle when you floss. Contact a health care provider if:  You are unsure if you are in labor or if your water has broken.  You become dizzy.  You have mild pelvic cramps, pelvic pressure, or nagging pain in your abdominal area.  You have lower back pain.  You have persistent  nausea, vomiting, or diarrhea.  You have an unusual or bad smelling vaginal discharge.  You have pain when you urinate. Get help right away if:  Your water breaks before 37 weeks.  You have regular contractions less than 5 minutes apart before 37 weeks.  You have a fever.  You are leaking fluid from your vagina.  You have spotting or bleeding from your vagina.  You have severe abdominal pain or cramping.  You have rapid weight loss or weight gain.    You have shortness of breath with chest pain.  You notice sudden or extreme swelling of your face, hands, ankles, feet, or legs.  Your baby makes fewer than 10 movements in 2 hours.  You have severe headaches that do not go away when you take medicine.  You have vision changes. Summary  The third trimester is from week 28 through week 40, months 7 through 9. The third trimester is a time when the unborn baby (fetus) is growing rapidly.  During the third trimester, your discomfort may increase as you and your baby continue to gain weight. You may have abdominal, leg, and back pain, sleeping problems, and an increased need to urinate.  During the third trimester your breasts will keep growing and they will continue to become tender. A yellow fluid (colostrum) may leak from your breasts. This is the first milk you are producing for your baby.  False labor is a condition in which you feel small, irregular tightenings of the muscles in the womb (contractions) that eventually go away. These are called Braxton Hicks contractions. Contractions may last for hours, days, or even weeks before true labor sets in.  Signs of labor can include: abdominal cramps; regular contractions that start at 10 minutes apart and become stronger and more frequent with time; watery or bloody mucus discharge that comes from the vagina; increased pelvic pressure and dull back pain; and leaking of amniotic fluid. This information is not intended to replace advice  given to you by your health care provider. Make sure you discuss any questions you have with your health care provider. Document Released: 09/26/2001 Document Revised: 03/09/2016 Document Reviewed: 12/03/2012 Elsevier Interactive Patient Education  2017 [employer]  

## 2018-07-26 NOTE — Progress Notes (Signed)
  Routine Prenatal Care Visit  Subjective  Natalie Pierce is a 22 y.o. G2P1001 at [redacted]w[redacted]d being seen today for ongoing prenatal care.  She is currently monitored for the following issues for this low-risk pregnancy and has Supervision of other normal pregnancy, antepartum on their problem list.  ----------------------------------------------------------------------------------- Patient reports no complaints.   Contractions: Not present. Vag. Bleeding: None.  Movement: Present. Denies leaking of fluid.  ----------------------------------------------------------------------------------- The following portions of the patient's history were reviewed and updated as appropriate: allergies, current medications, past family history, past medical history, past social history, past surgical history and problem list. Problem list updated.   Objective  Blood pressure 122/78, weight 194 lb (88 kg), last menstrual period 01/14/2018. Pregravid weight 170 lb (77.1 kg) Total Weight Gain 24 lb (10.9 kg) Urinalysis: Urine Protein    Urine Glucose    Fetal Status: Fetal Heart Rate (bpm): 152 Fundal Height: 28 cm Movement: Present     General:  Alert, oriented and cooperative. Patient is in no acute distress.  Skin: Skin is warm and dry. No rash noted.   Cardiovascular: Normal heart rate noted  Respiratory: Normal respiratory effort, no problems with respiration noted  Abdomen: Soft, gravid, appropriate for gestational age. Pain/Pressure: Absent     Pelvic:  Cervical exam deferred        Extremities: Normal range of motion.  Edema: None  Mental Status: Normal mood and affect. Normal behavior. Normal judgment and thought content.   Assessment   22 y.o. G2P1001 at [redacted]w[redacted]d by  10/21/2018, by Last Menstrual Period presenting for routine prenatal visit  Plan   Pregnancy#2 Problems (from 01/14/18 to present)    Problem Noted Resolved   Supervision of other normal pregnancy, antepartum 03/14/2018 by Tresea Mall, CNM No   Overview Addendum 06/28/2018  4:06 PM by [name], MD    Clinic [facility] Prenatal Labs  Dating  LMP =10 week [place] Blood type: O/Positive/-- (06/14 1640)   Genetic Screen     NIPS: normal XX Antibody:Negative (06/14 1640)  Anatomic [place] complete Rubella: 7.37 (06/14 1640) Varicella: Immune  GTT Third trimester:  RPR: Non Reactive (06/14 1640)   Rhogam Not needed HBsAg: Negative (06/14 1640)   TDaP vaccine                        Flu Shot: Declined HIV: Non Reactive (06/14 1640)   Baby Food  Breast                               GBS:   Contraception  Pap: NIL  CBB     CS/VBAC NA   Support Person  Boyfriend Trayvance               Preterm labor symptoms and general obstetric precautions including but not limited to vaginal bleeding, contractions, leaking of fluid and fetal movement were reviewed in detail with the patient. Please refer to After Visit Summary for other counseling recommendations.   Return in about 2 weeks (around 08/09/2018) for rob.  Tresea Mall, CNM 07/26/2018 3:20 PM

## 2018-07-27 LAB — 28 WEEK RH+PANEL
BASOS: 0 %
Basophils Absolute: 0 10*3/uL (ref 0.0–0.2)
EOS (ABSOLUTE): 0.1 10*3/uL (ref 0.0–0.4)
EOS: 1 %
Gestational Diabetes Screen: 78 mg/dL (ref 65–139)
HEMATOCRIT: 29 % — AB (ref 34.0–46.6)
HEMOGLOBIN: 9.6 g/dL — AB (ref 11.1–15.9)
HIV SCREEN 4TH GENERATION: NONREACTIVE
IMMATURE GRANS (ABS): 0.1 10*3/uL (ref 0.0–0.1)
Immature Granulocytes: 1 %
LYMPHS ABS: 2 10*3/uL (ref 0.7–3.1)
LYMPHS: 18 %
MCH: 28.6 pg (ref 26.6–33.0)
MCHC: 33.1 g/dL (ref 31.5–35.7)
MCV: 86 fL (ref 79–97)
MONOCYTES: 10 %
Monocytes Absolute: 1.1 10*3/uL — ABNORMAL HIGH (ref 0.1–0.9)
Neutrophils Absolute: 8 10*3/uL — ABNORMAL HIGH (ref 1.4–7.0)
Neutrophils: 70 %
Platelets: 288 10*3/uL (ref 150–450)
RBC: 3.36 x10E6/uL — ABNORMAL LOW (ref 3.77–5.28)
RDW: 13.9 % (ref 12.3–15.4)
RPR Ser Ql: NONREACTIVE
WBC: 11.4 10*3/uL — AB (ref 3.4–10.8)

## 2018-07-30 ENCOUNTER — Other Ambulatory Visit: Payer: Self-pay | Admitting: Obstetrics and Gynecology

## 2018-07-30 ENCOUNTER — Telehealth: Payer: Self-pay

## 2018-07-30 DIAGNOSIS — O99013 Anemia complicating pregnancy, third trimester: Secondary | ICD-10-CM

## 2018-07-30 MED ORDER — FERROUS SULFATE 325 (65 FE) MG PO TABS: 325.0000 mg | ORAL_TABLET | Freq: Two times a day (BID) | ORAL | 11 refills | Status: DC

## 2018-07-30 NOTE — Progress Notes (Signed)
Anemia- prescription for iron sent, released to mychart.

## 2018-07-30 NOTE — Telephone Encounter (Signed)
Spoke w/pt. She reports her pain is in her left upper abdomen. Denies contractions, lof, vb.  Advised could be d/t fetal position. Advised can try lying down (either side as tol) to see if can get pain relief.  She will monitor & contact us back if pain does not subside. Advised after hours Nurse line can advise if s&s worsen prior to tomorrow.

## 2018-07-30 NOTE — Telephone Encounter (Signed)
Pt reports she is having a sharp pain in her side that won't go away. Cb#[phone]

## 2018-07-30 NOTE — Telephone Encounter (Signed)
LMVM TRC. Notified pt that her message was distorted. Pt advised to return call to discuss further details.

## 2018-08-09 ENCOUNTER — Ambulatory Visit (INDEPENDENT_AMBULATORY_CARE_PROVIDER_SITE_OTHER): Payer: Medicaid Other | Admitting: Maternal Newborn

## 2018-08-09 ENCOUNTER — Encounter: Payer: Self-pay | Admitting: Maternal Newborn

## 2018-08-09 ENCOUNTER — Encounter: Payer: Medicaid Other | Admitting: Maternal Newborn

## 2018-08-09 VITALS — BP 114/60 | Wt 196.0 lb

## 2018-08-09 DIAGNOSIS — Z3480 Encounter for supervision of other normal pregnancy, unspecified trimester: Secondary | ICD-10-CM

## 2018-08-09 DIAGNOSIS — Z3483 Encounter for supervision of other normal pregnancy, third trimester: Secondary | ICD-10-CM

## 2018-08-09 DIAGNOSIS — Z3A29 29 weeks gestation of pregnancy: Secondary | ICD-10-CM

## 2018-08-09 LAB — POCT URINALYSIS DIPSTICK OB
Glucose, UA: NEGATIVE
POC,PROTEIN,UA: NEGATIVE

## 2018-08-09 NOTE — Progress Notes (Signed)
ROB- no concerns 

## 2018-08-09 NOTE — Patient Instructions (Signed)
Pregnancy and Anemia Anemia is a condition in which the concentration of red blood cells or hemoglobin in the blood is below normal. Hemoglobin is a substance in red blood cells that carries oxygen to the tissues of the body. Anemia results in not enough oxygen reaching these tissues. Anemia during pregnancy is common because the fetus uses more iron and folic acid as it is developing. Your body may not produce enough red blood cells because of this. Also, during pregnancy, the liquid part of the blood (plasma) increases by about 50%, and the red blood cells increase by only 25%. This lowers the concentration of the red blood cells and creates a natural anemia-like situation. What are the causes? The most common cause of anemia during pregnancy is not having enough iron in the body to make red blood cells (iron deficiency anemia). Other causes may include:  Folic acid deficiency.  Vitamin B12 deficiency.  Certain prescription or over-the-counter medicines.  Certain medical conditions or infections that destroy red blood cells.  A low platelet count and bleeding caused by antibodies that go through the placenta to the fetus from the mother's blood. What are the signs or symptoms? Mild anemia may not be noticeable. If it becomes severe, symptoms may include:  Tiredness.  Shortness of breath, especially with exercise.  Weakness.  Fainting.  Pale looking skin.  Headaches.  Feeling a fast or irregular heartbeat (palpitations). How is this diagnosed? The type of anemia is usually diagnosed from your family and medical history and blood tests. How is this treated? Treatment of anemia during pregnancy depends on the cause of the anemia. Treatment can include:  Supplements of iron, vitamin B12, or folic acid.  A blood transfusion. This may be needed if blood loss is severe.  Hospitalization. This may be needed if there is significant continual blood loss.  Dietary changes. Follow  these instructions at home:  Follow your dietitian's or health care provider's dietary recommendations.  Increase your vitamin C intake. This will help the stomach absorb more iron.  Eat a diet rich in iron. This would include foods such as:  Liver.  Beef.  Whole grain bread.  Eggs.  Dried fruit.  Take iron and vitamins as directed by your health care provider.  Eat green leafy vegetables. These are a good source of folic acid. Contact a health care provider if:  You have frequent or lasting headaches.  You are looking pale.  You are bruising easily. Get help right away if:  You have extreme weakness, shortness of breath, or chest pain.  You become dizzy or have trouble concentrating.  You have heavy vaginal bleeding.  You develop a rash.  You have bloody or black, tarry stools.  You faint.  You vomit up blood.  You vomit repeatedly.  You have abdominal pain.  You have a fever or persistent symptoms for more than 2-3 days.  You have a fever and your symptoms suddenly get worse.  You are dehydrated. This information is not intended to replace advice given to you by your health care provider. Make sure you discuss any questions you have with your health care provider. Document Released: 09/29/2000 Document Revised: 03/09/2016 Document Reviewed: 05/14/2013 Elsevier Interactive Patient Education  2017 [employer] Inc.  

## 2018-08-09 NOTE — Progress Notes (Signed)
    Routine Prenatal Care Visit  Subjective  Natalie Pierce is a 22 y.o. G2P1001 at [redacted]w[redacted]d being seen today for ongoing prenatal care.  She is currently monitored for the following issues for this low-risk pregnancy and has Supervision of other normal pregnancy, antepartum on their problem list.  ----------------------------------------------------------------------------------- Patient reports no complaints.   Contractions: Not present. Vag. Bleeding: None.  Movement: Present. No leaking of fluid.  ----------------------------------------------------------------------------------- The following portions of the patient's history were reviewed and updated as appropriate: allergies, current medications, past family history, past medical history, past social history, past surgical history and problem list. Problem list updated.   Objective  Blood pressure 114/60, weight 196 lb (88.9 kg), last menstrual period 01/14/2018. Pregravid weight 170 lb (77.1 kg) Total Weight Gain 26 lb (11.8 kg) Body mass index is 32.62 kg/m.    Urinalysis: Protein Negative, Glucose Negative Fetal Status: Fetal Heart Rate (bpm): 143 Fundal Height: 30 cm Movement: Present     General:  Alert, oriented and cooperative. Patient is in no acute distress.  Skin: Skin is warm and dry. No rash noted.   Cardiovascular: Normal heart rate noted  Respiratory: Normal respiratory effort, no problems with respiration noted  Abdomen: Soft, gravid, appropriate for gestational age. Pain/Pressure: Absent     Pelvic:  Cervical exam deferred        Extremities: Normal range of motion.  Edema: None  Mental Status: Normal mood and affect. Normal behavior. Normal judgment and thought content.     Assessment   21 y.o. G2P1001 at [redacted]w[redacted]d, EDD 10/21/2018 by Last Menstrual Period presenting for a routine prenatal visit.  Plan   Pregnancy#2 Problems (from 01/14/18 to present)    Problem Noted Resolved   Supervision of other normal  pregnancy, antepartum 03/14/2018 by Tresea Mall, CNM No   Overview Addendum 06/28/2018  4:06 PM by [name], MD    Clinic [facility] Prenatal Labs  Dating  LMP =10 week [place] Blood type: O/Positive/-- (06/14 1640)   Genetic Screen     NIPS: normal XX Antibody:Negative (06/14 1640)  Anatomic [place] complete Rubella: 7.37 (06/14 1640) Varicella: Immune  GTT Third trimester:  RPR: Non Reactive (06/14 1640)   Rhogam Not needed HBsAg: Negative (06/14 1640)   TDaP vaccine                        Flu Shot: Declined HIV: Non Reactive (06/14 1640)   Baby Food  Breast                               GBS:   Contraception  Pap: NIL  CBB     CS/VBAC NA   Support Person  Boyfriend Trayvance            Not currently taking iron. Recommended supplementation with Rx or OTC iron tablets for findings of anemia with 28 week labs.   Please refer to After Visit Summary for other counseling recommendations.   Return in about 2 weeks (around 08/23/2018) for ROB.  Marcelyn Bruins, CNM 08/09/2018  4:07 PM

## 2018-08-23 ENCOUNTER — Ambulatory Visit (INDEPENDENT_AMBULATORY_CARE_PROVIDER_SITE_OTHER): Payer: Medicaid Other | Admitting: Advanced Practice Midwife

## 2018-08-23 ENCOUNTER — Encounter: Payer: Self-pay | Admitting: Advanced Practice Midwife

## 2018-08-23 VITALS — BP 110/70 | Wt 201.0 lb

## 2018-08-23 DIAGNOSIS — Z3A31 31 weeks gestation of pregnancy: Secondary | ICD-10-CM

## 2018-08-23 LAB — POCT URINALYSIS DIPSTICK OB
Glucose, UA: NEGATIVE
POC,PROTEIN,UA: NEGATIVE

## 2018-08-23 NOTE — Progress Notes (Signed)
  Routine Prenatal Care Visit  Subjective  Natalie Pierce is a 22 y.o. G2P1001 at [redacted]w[redacted]d being seen today for ongoing prenatal care.  She is currently monitored for the following issues for this low-risk pregnancy and has Supervision of other normal pregnancy, antepartum on their problem list.  ----------------------------------------------------------------------------------- Patient reports no complaints.   Contractions: Not present. Vag. Bleeding: None.  Movement: Present. Denies leaking of fluid.  ----------------------------------------------------------------------------------- The following portions of the patient's history were reviewed and updated as appropriate: allergies, current medications, past family history, past medical history, past social history, past surgical history and problem list. Problem list updated.   Objective  Blood pressure 110/70, weight 201 lb (91.2 kg), last menstrual period 01/14/2018. Pregravid weight 170 lb (77.1 kg) Total Weight Gain 31 lb (14.1 kg) Urinalysis: Urine Protein    Urine Glucose    Fetal Status: Fetal Heart Rate (bpm): 134 Fundal Height: 33 cm Movement: Present     General:  Alert, oriented and cooperative. Patient is in no acute distress.  Skin: Skin is warm and dry. No rash noted.   Cardiovascular: Normal heart rate noted  Respiratory: Normal respiratory effort, no problems with respiration noted  Abdomen: Soft, gravid, appropriate for gestational age. Pain/Pressure: Present     Pelvic:  Cervical exam deferred        Extremities: Normal range of motion.  Edema: None  Mental Status: Normal mood and affect. Normal behavior. Normal judgment and thought content.   Assessment   22 y.o. G2P1001 at [redacted]w[redacted]d by  10/21/2018, by Last Menstrual Period presenting for routine prenatal visit  Plan   Pregnancy#2 Problems (from 01/14/18 to present)    Problem Noted Resolved   Supervision of other normal pregnancy, antepartum 03/14/2018 by Tresea Mall, CNM No   Overview Addendum 06/28/2018  4:06 PM by [name], MD    [facility] Prenatal Labs  Dating  LMP =10 week [place] Blood type: O/Positive/-- (06/14 1640)   Genetic Screen     NIPS: normal XX Antibody:Negative (06/14 1640)  Anatomic [place] complete Rubella: 7.37 (06/14 1640) Varicella: Immune  GTT Third trimester:  RPR: Non Reactive (06/14 1640)   Rhogam Not needed HBsAg: Negative (06/14 1640)   TDaP vaccine                        Flu Shot: Declined HIV: Non Reactive (06/14 1640)   Baby Food  Breast                               GBS:   Contraception  Pap: NIL  CBB     CS/VBAC NA   Support Person  Boyfriend Trayvance               Preterm labor symptoms and general obstetric precautions including but not limited to vaginal bleeding, contractions, leaking of fluid and fetal movement were reviewed in detail with the patient.   Return in about 2 weeks (around 09/06/2018) for rob.  Tresea Mall, CNM 08/23/2018 3:07 PM

## 2018-08-23 NOTE — Addendum Note (Signed)
Addended by: Cornelius Moras D on: 08/23/2018 03:23 PM   Modules accepted: Orders

## 2018-09-06 ENCOUNTER — Ambulatory Visit (INDEPENDENT_AMBULATORY_CARE_PROVIDER_SITE_OTHER): Payer: Medicaid Other | Admitting: Advanced Practice Midwife

## 2018-09-06 ENCOUNTER — Encounter: Payer: Self-pay | Admitting: Advanced Practice Midwife

## 2018-09-06 VITALS — BP 130/80 | Wt 203.0 lb

## 2018-09-06 DIAGNOSIS — Z3A33 33 weeks gestation of pregnancy: Secondary | ICD-10-CM

## 2018-09-06 LAB — POCT URINALYSIS DIPSTICK OB
Glucose, UA: NEGATIVE
PROTEIN: NEGATIVE

## 2018-09-06 NOTE — Progress Notes (Signed)
ROB- no concerns, BP rechecked-114/80

## 2018-09-06 NOTE — Progress Notes (Signed)
  Routine Prenatal Care Visit  Subjective  Natalie Pierce is a 22 y.o. G2P1001 at 6482w4d being seen today for ongoing prenatal care.  She is currently monitored for the following issues for this low-risk pregnancy and has Supervision of other normal pregnancy, antepartum on their problem list.  ----------------------------------------------------------------------------------- Patient reports no complaints.   Contractions: Not present. Vag. Bleeding: None.  Movement: Present. Denies leaking of fluid.  ----------------------------------------------------------------------------------- The following portions of the patient's history were reviewed and updated as appropriate: allergies, current medications, past family history, past medical history, past social history, past surgical history and problem list. Problem list updated.   Objective  Blood pressure 130/80, weight 203 lb (92.1 kg), last menstrual period 01/14/2018. Pregravid weight 170 lb (77.1 kg) Total Weight Gain 33 lb (15 kg)  Repeat blood pressure: 114/80  Urinalysis: Urine Protein    Urine Glucose    Fetal Status: Fetal Heart Rate (bpm): 140 Fundal Height: 34 cm Movement: Present     General:  Alert, oriented and cooperative. Patient is in no acute distress.  Skin: Skin is warm and dry. No rash noted.   Cardiovascular: Normal heart rate noted  Respiratory: Normal respiratory effort, no problems with respiration noted  Abdomen: Soft, gravid, appropriate for gestational age. Pain/Pressure: Absent     Pelvic:  Cervical exam deferred        Extremities: Normal range of motion.  Edema: None  Mental Status: Normal mood and affect. Normal behavior. Normal judgment and thought content.   Assessment   22 y.o. G2P1001 at 6682w4d by  10/21/2018, by Last Menstrual Period presenting for routine prenatal visit  Plan   Pregnancy#2 Problems (from 01/14/18 to present)    Problem Noted Resolved   Supervision of other normal pregnancy,  antepartum 03/14/2018 by Natalie MallGledhill, Pepper Kerrick, CNM No   Overview Addendum 06/28/2018  4:06 PM by Natale MilchSchuman, Christanna R, MD    [facility] Prenatal Labs  Dating  LMP =10 week US Blood type: O/Positive/-- (06/14 1640)   Genetic Screen     NIPS: normal XX Antibody:Negative (06/14 1640)  Anatomic US complete Rubella: 7.37 (06/14 1640) Varicella: Immune  GTT Third trimester:  RPR: Non Reactive (06/14 1640)   Rhogam Not needed HBsAg: Negative (06/14 1640)   TDaP vaccine                        Flu Shot: Declined HIV: Non Reactive (06/14 1640)   Baby Food  Breast                               GBS:   Contraception  Pap: NIL  CBB     CS/VBAC NA   Support Person  Boyfriend Natalie Pierce               Preterm labor symptoms and general obstetric precautions including but not limited to vaginal bleeding, contractions, leaking of fluid and fetal movement were reviewed in detail with the patient.   Return in about 2 weeks (around 09/20/2018) for rob.  Natalie Pierce, CNM 09/06/2018 3:41 PM

## 2018-09-20 ENCOUNTER — Encounter: Payer: Self-pay | Admitting: Advanced Practice Midwife

## 2018-09-20 ENCOUNTER — Ambulatory Visit (INDEPENDENT_AMBULATORY_CARE_PROVIDER_SITE_OTHER): Payer: Medicaid Other | Admitting: Advanced Practice Midwife

## 2018-09-20 VITALS — BP 128/80 | Wt 205.0 lb

## 2018-09-20 DIAGNOSIS — Z3A35 35 weeks gestation of pregnancy: Secondary | ICD-10-CM

## 2018-09-20 LAB — POCT URINALYSIS DIPSTICK OB
Glucose, UA: NEGATIVE
PROTEIN: NEGATIVE

## 2018-09-20 NOTE — Patient Instructions (Signed)
Braxton Hicks Contractions °Contractions of the uterus can occur throughout pregnancy, but they are not always a sign that you are in labor. You may have practice contractions called Braxton Hicks contractions. These false labor contractions are sometimes confused with true labor. °What are Braxton Hicks contractions? °Braxton Hicks contractions are tightening movements that occur in the muscles of the uterus before labor. Unlike true labor contractions, these contractions do not result in opening (dilation) and thinning of the cervix. Toward the end of pregnancy (32-34 weeks), Braxton Hicks contractions can happen more often and may become stronger. These contractions are sometimes difficult to tell apart from true labor because they can be very uncomfortable. You should not feel embarrassed if you go to the hospital with false labor. °Sometimes, the only way to tell if you are in true labor is for your health care provider to look for changes in the cervix. The health care provider will do a physical exam and may monitor your contractions. If you are not in true labor, the exam should show that your cervix is not dilating and your water has not broken. °If there are other health problems associated with your pregnancy, it is completely safe for you to be sent home with false labor. You may continue to have Braxton Hicks contractions until you go into true labor. °How to tell the difference between true labor and false labor °True labor °· Contractions last 30-70 seconds. °· Contractions become very regular. °· Discomfort is usually felt in the top of the uterus, and it spreads to the lower abdomen and low back. °· Contractions do not go away with walking. °· Contractions usually become more intense and increase in frequency. °· The cervix dilates and gets thinner. °False labor °· Contractions are usually shorter and not as strong as true labor contractions. °· Contractions are usually irregular. °· Contractions  are often felt in the front of the lower abdomen and in the groin. °· Contractions may go away when you walk around or change positions while lying down. °· Contractions get weaker and are shorter-lasting as time goes on. °· The cervix usually does not dilate or become thin. °Follow these instructions at home: °· Take over-the-counter and prescription medicines only as told by your health care provider. °· Keep up with your usual exercises and follow other instructions from your health care provider. °· Eat and drink lightly if you think you are going into labor. °· If Braxton Hicks contractions are making you uncomfortable: °? Change your position from lying down or resting to walking, or change from walking to resting. °? Sit and rest in a tub of warm water. °? Drink enough fluid to keep your urine pale yellow. Dehydration may cause these contractions. °? Do slow and deep breathing several times an hour. °· Keep all follow-up prenatal visits as told by your health care provider. This is important. °Contact a health care provider if: °· You have a fever. °· You have continuous pain in your abdomen. °Get help right away if: °· Your contractions become stronger, more regular, and closer together. °· You have fluid leaking or gushing from your vagina. °· You pass blood-tinged mucus (bloody show). °· You have bleeding from your vagina. °· You have low back pain that you never had before. °· You feel your baby’s head pushing down and causing pelvic pressure. °· Your baby is not moving inside you as much as it used to. °Summary °· Contractions that occur before labor are called Braxton   Hicks contractions, false labor, or practice contractions. °· Braxton Hicks contractions are usually shorter, weaker, farther apart, and less regular than true labor contractions. True labor contractions usually become progressively stronger and regular and they become more frequent. °· Manage discomfort from Braxton Hicks contractions by  changing position, resting in a warm bath, drinking plenty of water, or practicing deep breathing. °This information is not intended to replace advice given to you by your health care provider. Make sure you discuss any questions you have with your health care provider. °Document Released: 02/15/2017 Document Revised: 02/15/2017 Document Reviewed: 02/15/2017 °Elsevier Interactive Patient Education © 2018 [employer] ° °

## 2018-09-20 NOTE — Progress Notes (Signed)
  Routine Prenatal Care Visit  Subjective  Ana Erin FullingD Cala is a 22 y.o. G2P1001 at 857w4d being seen today for ongoing prenatal care.  She is currently monitored for the following issues for this low-risk pregnancy and has Supervision of other normal pregnancy, antepartum on their problem list.  ----------------------------------------------------------------------------------- Patient reports no complaints.   Contractions: Not present. Vag. Bleeding: None.  Movement: Present. Denies leaking of fluid.  ----------------------------------------------------------------------------------- The following portions of the patient's history were reviewed and updated as appropriate: allergies, current medications, past family history, past medical history, past social history, past surgical history and problem list. Problem list updated.   Objective  Blood pressure 128/80, weight 205 lb (93 kg), last menstrual period 01/14/2018. Pregravid weight 170 lb (77.1 kg) Total Weight Gain 35 lb (15.9 kg) Urinalysis: Urine Protein Negative  Urine Glucose Negative  Fetal Status: Fetal Heart Rate (bpm): 127 Fundal Height: 36 cm Movement: Present     General:  Alert, oriented and cooperative. Patient is in no acute distress.  Skin: Skin is warm and dry. No rash noted.   Cardiovascular: Normal heart rate noted  Respiratory: Normal respiratory effort, no problems with respiration noted  Abdomen: Soft, gravid, appropriate for gestational age. Pain/Pressure: Absent     Pelvic:  Cervical exam deferred        Extremities: Normal range of motion.  Edema: None  Mental Status: Normal mood and affect. Normal behavior. Normal judgment and thought content.   Assessment   22 y.o. G2P1001 at 417w4d by  10/21/2018, by Last Menstrual Period presenting for routine prenatal visit  Plan   Pregnancy#2 Problems (from 01/14/18 to present)    Problem Noted Resolved   Supervision of other normal pregnancy, antepartum 03/14/2018 by  Tresea MallGledhill, Aaira Oestreicher, CNM No   Overview Addendum 06/28/2018  4:06 PM by Natale MilchSchuman, Christanna R, MD    [facility] Prenatal Labs  Dating  LMP =10 week US Blood type: O/Positive/-- (06/14 1640)   Genetic Screen     NIPS: normal XX Antibody:Negative (06/14 1640)  Anatomic US complete Rubella: 7.37 (06/14 1640) Varicella: Immune  GTT Third trimester:  RPR: Non Reactive (06/14 1640)   Rhogam Not needed HBsAg: Negative (06/14 1640)   TDaP vaccine                        Flu Shot: Declined HIV: Non Reactive (06/14 1640)   Baby Food  Breast                               GBS:   Contraception  Pap: NIL  CBB     CS/VBAC NA   Support Person  Boyfriend Trayvance               Preterm labor symptoms and general obstetric precautions including but not limited to vaginal bleeding, contractions, leaking of fluid and fetal movement were reviewed in detail with the patient. Please refer to After Visit Summary for other counseling recommendations.   Return in about 1 week (around 09/27/2018) for rob.  Tresea MallJane Kynley Metzger, CNM 09/20/2018 8:24 AM

## 2018-09-20 NOTE — Progress Notes (Signed)
ROB

## 2018-09-27 ENCOUNTER — Other Ambulatory Visit (HOSPITAL_COMMUNITY)
Admission: RE | Admit: 2018-09-27 | Discharge: 2018-09-27 | Disposition: A | Discharge status: Home / Self Care | Payer: Medicaid Other | Source: Ambulatory Visit | Attending: Certified Nurse Midwife | Admitting: Certified Nurse Midwife

## 2018-09-27 ENCOUNTER — Ambulatory Visit (INDEPENDENT_AMBULATORY_CARE_PROVIDER_SITE_OTHER): Payer: Medicaid Other | Admitting: Certified Nurse Midwife

## 2018-09-27 ENCOUNTER — Encounter: Payer: Self-pay | Admitting: Certified Nurse Midwife

## 2018-09-27 VITALS — BP 112/70 | Wt 202.0 lb

## 2018-09-27 DIAGNOSIS — Z3480 Encounter for supervision of other normal pregnancy, unspecified trimester: Secondary | ICD-10-CM | POA: Diagnosis not present

## 2018-09-27 DIAGNOSIS — Z113 Encounter for screening for infections with a predominantly sexual mode of transmission: Secondary | ICD-10-CM | POA: Diagnosis not present

## 2018-09-27 DIAGNOSIS — Z3A36 36 weeks gestation of pregnancy: Secondary | ICD-10-CM

## 2018-09-27 DIAGNOSIS — Z3483 Encounter for supervision of other normal pregnancy, third trimester: Secondary | ICD-10-CM

## 2018-09-27 DIAGNOSIS — Z3685 Encounter for antenatal screening for Streptococcus B: Secondary | ICD-10-CM | POA: Diagnosis not present

## 2018-09-27 LAB — POCT URINALYSIS DIPSTICK OB
Glucose, UA: NEGATIVE
PROTEIN: NEGATIVE

## 2018-09-27 MED ORDER — CLOTRIMAZOLE-BETAMETHASONE 1-0.05 % EX CREA: 1.0000 "application " | TOPICAL_CREAM | Freq: Two times a day (BID) | CUTANEOUS | 0 refills | Status: DC | PRN

## 2018-09-27 MED ORDER — FLUCONAZOLE 150 MG PO TABS: ORAL_TABLET | ORAL | 0 refills | Status: DC

## 2018-09-27 NOTE — Progress Notes (Signed)
ROB and GBS today- no concerns

## 2018-09-29 LAB — STREP GP B NAA: Strep Gp B NAA: NEGATIVE

## 2018-10-01 LAB — CERVICOVAGINAL ANCILLARY ONLY
Chlamydia: NEGATIVE
NEISSERIA GONORRHEA: NEGATIVE
Trichomonas: NEGATIVE

## 2018-10-03 NOTE — Progress Notes (Signed)
ROB at 36wk4d: Doing well. Baby active.Having some vulvar itching Vulva: vestibule inflamed and off white discharge at introitus Vagina: off white discharge Wet prep + hyphae, negative Trich and clue cells Aptima and GBS done Labor precautions RX for Diflucan 150 mgm and Lotrisone cream externally ROB in 1 week. Farrel Connersolleen Oneita Allmon, CNM

## 2018-10-04 ENCOUNTER — Encounter: Payer: Self-pay | Admitting: Maternal Newborn

## 2018-10-04 ENCOUNTER — Ambulatory Visit (INDEPENDENT_AMBULATORY_CARE_PROVIDER_SITE_OTHER): Payer: Medicaid Other | Admitting: Maternal Newborn

## 2018-10-04 VITALS — BP 110/82 | Wt 205.0 lb

## 2018-10-04 DIAGNOSIS — Z3A37 37 weeks gestation of pregnancy: Secondary | ICD-10-CM

## 2018-10-04 DIAGNOSIS — Z3480 Encounter for supervision of other normal pregnancy, unspecified trimester: Secondary | ICD-10-CM

## 2018-10-04 DIAGNOSIS — Z3483 Encounter for supervision of other normal pregnancy, third trimester: Secondary | ICD-10-CM

## 2018-10-04 LAB — POCT URINALYSIS DIPSTICK OB: Glucose, UA: NEGATIVE

## 2018-10-04 NOTE — Progress Notes (Signed)
    Routine Prenatal Care Visit  Subjective  Natalie Pierce is a 22 y.o. G2P1001 at 4169w4d being seen today for ongoing prenatal care.  She is currently monitored for the following issues for this low-risk pregnancy and has Supervision of other normal pregnancy, antepartum on their problem list.  ----------------------------------------------------------------------------------- Patient reports no complaints.   Contractions: Not present. Vag. Bleeding: None.  Movement: Present. No leaking of fluid.  ----------------------------------------------------------------------------------- The following portions of the patient's history were reviewed and updated as appropriate: allergies, current medications, past family history, past medical history, past social history, past surgical history and problem list. Problem list updated.  Objective  Blood pressure 110/82, weight 205 lb (93 kg), last menstrual period 01/14/2018. Pregravid weight 170 lb (77.1 kg) Total Weight Gain 35 lb (15.9 kg) Urinalysis: Protein Trace, Glucose Negative  Fetal Status: Fetal Heart Rate (bpm): 145 Fundal Height: 37 cm Movement: Present     General:  Alert, oriented and cooperative. Patient is in no acute distress.  Skin: Skin is warm and dry. No rash noted.   Cardiovascular: Normal heart rate noted  Respiratory: Normal respiratory effort, no problems with respiration noted  Abdomen: Soft, gravid, appropriate for gestational age. Pain/Pressure: Absent     Pelvic:  Cervical exam deferred        Extremities: Normal range of motion.  Edema: None  Mental Status: Normal mood and affect. Normal behavior. Normal judgment and thought content.     Assessment   22 y.o. G2P1001 at 6369w4d, EDD 10/21/2018 by Last Menstrual Period presenting for a routine prenatal visit.  Plan   Pregnancy#2 Problems (from 01/14/18 to present)    Problem Noted Resolved   Supervision of other normal pregnancy, antepartum 03/14/2018 by Tresea MallGledhill,  Jane, CNM No   Overview Addendum 09/30/2018  4:27 PM by Farrel ConnersGutierrez, Colleen, CNM    [facility] Prenatal Labs  Dating  LMP =10 week US Blood type: O/Positive/-- (06/14 1640)   Genetic Screen     NIPS: normal XX Antibody:Negative (06/14 1640)  Anatomic US complete Rubella: 7.37 (06/14 1640) Varicella: Immune  GTT Third trimester: 78 RPR: Non Reactive (06/14 1640)   Rhogam Not needed HBsAg: Negative (06/14 1640)   TDaP vaccine                        Flu Shot: Declined HIV: Non Reactive (06/14 1640)   Baby Food  Breast                               GBS: neg  Contraception  Pap: NIL  CBB     CS/VBAC NA   Support Person  Boyfriend Natalie Pierce              Term labor symptoms and general obstetric precautions including but not limited to vaginal bleeding, contractions, leaking of fluid and fetal movement were reviewed.  Please refer to After Visit Summary for other counseling recommendations.   Return in about 1 week (around 10/11/2018) for ROB.  Natalie Pierce, CNM 10/04/2018  8:54 AM

## 2018-10-04 NOTE — Patient Instructions (Signed)

## 2018-10-04 NOTE — Progress Notes (Signed)
ROB- no concerns 

## 2018-10-11 ENCOUNTER — Encounter: Payer: Self-pay | Admitting: Advanced Practice Midwife

## 2018-10-11 ENCOUNTER — Ambulatory Visit (INDEPENDENT_AMBULATORY_CARE_PROVIDER_SITE_OTHER): Payer: Medicaid Other | Admitting: Advanced Practice Midwife

## 2018-10-11 VITALS — BP 116/84 | Wt 208.0 lb

## 2018-10-11 DIAGNOSIS — Z3A38 38 weeks gestation of pregnancy: Secondary | ICD-10-CM

## 2018-10-11 NOTE — Progress Notes (Signed)
  Routine Prenatal Care Visit  Subjective  Natalie Pierce Spake is a 22 y.o. G2P1001 at 9264w4d being seen today for ongoing prenatal care.  She is currently monitored for the following issues for this low-risk pregnancy and has Supervision of other normal pregnancy, antepartum on their problem list.  ----------------------------------------------------------------------------------- Patient reports no complaints.   Contractions: Not present. Vag. Bleeding: None.  Movement: Present. Denies leaking of fluid.  ----------------------------------------------------------------------------------- The following portions of the patient's history were reviewed and updated as appropriate: allergies, current medications, past family history, past medical history, past social history, past surgical history and problem list. Problem list updated.   Objective  Blood pressure 116/84, weight 208 lb (94.3 kg), last menstrual period 01/14/2018. Pregravid weight 170 lb (77.1 kg) Total Weight Gain 38 lb (17.2 kg) Urinalysis: Urine Protein    Urine Glucose    Fetal Status: Fetal Heart Rate (bpm): 141 Fundal Height: 39 cm Movement: Present     General:  Alert, oriented and cooperative. Patient is in no acute distress.  Skin: Skin is warm and dry. No rash noted.   Cardiovascular: Normal heart rate noted  Respiratory: Normal respiratory effort, no problems with respiration noted  Abdomen: Soft, gravid, appropriate for gestational age. Pain/Pressure: Present     Pelvic:  Cervical exam deferred        Extremities: Normal range of motion.  Edema: Trace  Mental Status: Normal mood and affect. Normal behavior. Normal judgment and thought content.   Assessment   22 y.o. G2P1001 at 5464w4d by  10/21/2018, by Last Menstrual Period presenting for routine prenatal visit  Plan   Pregnancy#2 Problems (from 01/14/18 to present)    Problem Noted Resolved   Supervision of other normal pregnancy, antepartum 03/14/2018 by Tresea MallGledhill,  Wyatte Dames, CNM No   Overview Addendum 09/30/2018  4:27 PM by Farrel ConnersGutierrez, Colleen, CNM    [facility] Prenatal Labs  Dating  LMP =10 week US Blood type: O/Positive/-- (06/14 1640)   Genetic Screen     NIPS: normal XX Antibody:Negative (06/14 1640)  Anatomic US complete Rubella: 7.37 (06/14 1640) Varicella: Immune  GTT Third trimester: 78 RPR: Non Reactive (06/14 1640)   Rhogam Not needed HBsAg: Negative (06/14 1640)   TDaP vaccine                        Flu Shot: Declined HIV: Non Reactive (06/14 1640)   Baby Food  Breast                               GBS: neg  Contraception  Pap: NIL  CBB     CS/VBAC NA   Support Person  Boyfriend Trayvance               Term labor symptoms and general obstetric precautions including but not limited to vaginal bleeding, contractions, leaking of fluid and fetal movement were reviewed in detail with the patient.    Return in about 1 week (around 10/18/2018) for rob.  Tresea MallJane Zamoria Boss, CNM 10/11/2018 8:24 AM

## 2018-10-11 NOTE — Progress Notes (Signed)
No vb. No lof.  

## 2018-10-15 ENCOUNTER — Inpatient Hospital Stay: Payer: Medicaid Other | Admitting: Anesthesiology

## 2018-10-15 ENCOUNTER — Inpatient Hospital Stay
Admission: EM | Admit: 2018-10-15 | Discharge: 2018-10-16 | DRG: 807 | Disposition: A | Discharge status: Home / Self Care | Payer: Medicaid Other | Attending: Obstetrics and Gynecology | Admitting: Obstetrics and Gynecology

## 2018-10-15 ENCOUNTER — Other Ambulatory Visit: Payer: Self-pay

## 2018-10-15 ENCOUNTER — Encounter: Payer: Self-pay | Admitting: *Deleted

## 2018-10-15 DIAGNOSIS — O364XX0 Maternal care for intrauterine death, not applicable or unspecified: Secondary | ICD-10-CM | POA: Diagnosis not present

## 2018-10-15 DIAGNOSIS — D649 Anemia, unspecified: Secondary | ICD-10-CM | POA: Diagnosis present

## 2018-10-15 DIAGNOSIS — O9902 Anemia complicating childbirth: Secondary | ICD-10-CM | POA: Diagnosis present

## 2018-10-15 DIAGNOSIS — Z3480 Encounter for supervision of other normal pregnancy, unspecified trimester: Secondary | ICD-10-CM

## 2018-10-15 DIAGNOSIS — Z3A39 39 weeks gestation of pregnancy: Secondary | ICD-10-CM

## 2018-10-15 DIAGNOSIS — O411431 Placentitis, third trimester, fetus 1: Secondary | ICD-10-CM | POA: Diagnosis not present

## 2018-10-15 HISTORY — DX: Anemia, unspecified: D64.9

## 2018-10-15 LAB — T4, FREE: Free T4: 0.65 ng/dL — ABNORMAL LOW (ref 0.82–1.77)

## 2018-10-15 LAB — URINE DRUG SCREEN, QUALITATIVE (ARMC ONLY)
Amphetamines, Ur Screen: NOT DETECTED
Barbiturates, Ur Screen: NOT DETECTED
Benzodiazepine, Ur Scrn: NOT DETECTED
Cannabinoid 50 Ng, Ur ~~LOC~~: NOT DETECTED
Cocaine Metabolite,Ur ~~LOC~~: NOT DETECTED
MDMA (Ecstasy)Ur Screen: NOT DETECTED
METHADONE SCREEN, URINE: NOT DETECTED
Opiate, Ur Screen: NOT DETECTED
Phencyclidine (PCP) Ur S: NOT DETECTED
Tricyclic, Ur Screen: NOT DETECTED

## 2018-10-15 LAB — CBC
HCT: 31.6 % — ABNORMAL LOW (ref 36.0–46.0)
Hemoglobin: 10 g/dL — ABNORMAL LOW (ref 12.0–15.0)
MCH: 25.2 pg — ABNORMAL LOW (ref 26.0–34.0)
MCHC: 31.6 g/dL (ref 30.0–36.0)
MCV: 79.6 fL — ABNORMAL LOW (ref 80.0–100.0)
NRBC: 0.3 % — AB (ref 0.0–0.2)
Platelets: 285 10*3/uL (ref 150–400)
RBC: 3.97 MIL/uL (ref 3.87–5.11)
RDW: 16.3 % — ABNORMAL HIGH (ref 11.5–15.5)
WBC: 10.7 10*3/uL — ABNORMAL HIGH (ref 4.0–10.5)

## 2018-10-15 LAB — RAPID HIV SCREEN (HIV 1/2 AB+AG)
HIV 1/2 Antibodies: NONREACTIVE
HIV-1 P24 Antigen - HIV24: NONREACTIVE

## 2018-10-15 LAB — APTT: APTT: 25 s (ref 24–36)

## 2018-10-15 LAB — PROTIME-INR
INR: 0.97
Prothrombin Time: 12.8 seconds (ref 11.4–15.2)

## 2018-10-15 LAB — KLEIHAUER-BETKE STAIN
Fetal Cells %: 0 %
Quantitation Fetal Hemoglobin: 0 mL

## 2018-10-15 LAB — TSH: TSH: 2.505 u[IU]/mL (ref 0.350–4.500)

## 2018-10-15 LAB — TYPE AND SCREEN
ABO/RH(D): O POS
ANTIBODY SCREEN: NEGATIVE

## 2018-10-15 LAB — FIBRINOGEN: FIBRINOGEN: 681 mg/dL — AB (ref 210–475)

## 2018-10-15 MED ORDER — IBUPROFEN 600 MG PO TABS
600.0000 mg | ORAL_TABLET | Freq: Four times a day (QID) | ORAL | Status: DC
  Administered 2018-10-15: 600 mg via ORAL
  Filled 2018-10-15: qty 1

## 2018-10-15 MED ORDER — AMMONIA AROMATIC IN INHA
RESPIRATORY_TRACT | Status: AC
  Filled 2018-10-15: qty 10

## 2018-10-15 MED ORDER — ZOLPIDEM TARTRATE 5 MG PO TABS: 5.0000 mg | ORAL_TABLET | Freq: Every evening | ORAL | Status: DC | PRN

## 2018-10-15 MED ORDER — LIDOCAINE HCL (PF) 1 % IJ SOLN
INTRAMUSCULAR | Status: AC
  Filled 2018-10-15: qty 30

## 2018-10-15 MED ORDER — EPHEDRINE 5 MG/ML INJ: 10.0000 mg | INTRAVENOUS | Status: DC | PRN

## 2018-10-15 MED ORDER — BUTORPHANOL TARTRATE 2 MG/ML IJ SOLN
INTRAMUSCULAR | Status: AC
  Administered 2018-10-15: 2 mg via INTRAVENOUS
  Filled 2018-10-15: qty 1

## 2018-10-15 MED ORDER — BENZOCAINE-MENTHOL 20-0.5 % EX AERO
1.0000 "application " | INHALATION_SPRAY | CUTANEOUS | Status: DC | PRN
  Filled 2018-10-15: qty 56

## 2018-10-15 MED ORDER — DIPHENHYDRAMINE HCL 25 MG PO CAPS: 25.0000 mg | ORAL_CAPSULE | Freq: Four times a day (QID) | ORAL | Status: DC | PRN

## 2018-10-15 MED ORDER — ONDANSETRON HCL 4 MG/2ML IJ SOLN
4.0000 mg | Freq: Four times a day (QID) | INTRAMUSCULAR | Status: DC | PRN
  Administered 2018-10-15: 4 mg via INTRAVENOUS
  Filled 2018-10-15: qty 2

## 2018-10-15 MED ORDER — OXYTOCIN 10 UNIT/ML IJ SOLN
INTRAMUSCULAR | Status: AC
  Filled 2018-10-15: qty 2

## 2018-10-15 MED ORDER — OXYTOCIN 40 UNITS IN LACTATED RINGERS INFUSION - SIMPLE MED: 2.5000 [IU]/h | INTRAVENOUS | Status: DC

## 2018-10-15 MED ORDER — MISOPROSTOL 200 MCG PO TABS
ORAL_TABLET | ORAL | Status: AC
  Filled 2018-10-15: qty 4

## 2018-10-15 MED ORDER — WITCH HAZEL-GLYCERIN EX PADS: 1.0000 "application " | MEDICATED_PAD | CUTANEOUS | Status: DC | PRN

## 2018-10-15 MED ORDER — ONDANSETRON HCL 4 MG/2ML IJ SOLN: 4.0000 mg | INTRAMUSCULAR | Status: DC | PRN

## 2018-10-15 MED ORDER — ONDANSETRON HCL 4 MG PO TABS: 4.0000 mg | ORAL_TABLET | ORAL | Status: DC | PRN

## 2018-10-15 MED ORDER — FENTANYL 2.5 MCG/ML W/ROPIVACAINE 0.15% IN NS 100 ML EPIDURAL (ARMC)
EPIDURAL | Status: AC
  Filled 2018-10-15: qty 100

## 2018-10-15 MED ORDER — OXYTOCIN BOLUS FROM INFUSION
500.0000 mL | Freq: Once | INTRAVENOUS | Status: AC
  Administered 2018-10-15: 500 mL via INTRAVENOUS

## 2018-10-15 MED ORDER — LACTATED RINGERS IV SOLN: 500.0000 mL | Freq: Once | INTRAVENOUS | Status: DC

## 2018-10-15 MED ORDER — MAGNESIUM HYDROXIDE 400 MG/5ML PO SUSP
30.0000 mL | ORAL | Status: DC | PRN
  Filled 2018-10-15: qty 30

## 2018-10-15 MED ORDER — BUTORPHANOL TARTRATE 2 MG/ML IJ SOLN
1.0000 mg | INTRAMUSCULAR | Status: DC | PRN
  Administered 2018-10-15: 2 mg via INTRAVENOUS

## 2018-10-15 MED ORDER — SIMETHICONE 80 MG PO CHEW: 80.0000 mg | CHEWABLE_TABLET | ORAL | Status: DC | PRN

## 2018-10-15 MED ORDER — LIDOCAINE-EPINEPHRINE (PF) 1.5 %-1:200000 IJ SOLN
INTRAMUSCULAR | Status: DC | PRN
  Administered 2018-10-15: 3 mL via EPIDURAL

## 2018-10-15 MED ORDER — LIDOCAINE HCL (PF) 1 % IJ SOLN
INTRAMUSCULAR | Status: DC | PRN
  Administered 2018-10-15: 4 mL via SUBCUTANEOUS

## 2018-10-15 MED ORDER — DIPHENHYDRAMINE HCL 50 MG/ML IJ SOLN: 12.5000 mg | INTRAMUSCULAR | Status: DC | PRN

## 2018-10-15 MED ORDER — FENTANYL 2.5 MCG/ML W/ROPIVACAINE 0.15% IN NS 100 ML EPIDURAL (ARMC)
12.0000 mL/h | EPIDURAL | Status: DC
  Administered 2018-10-15: 12 mL/h via EPIDURAL

## 2018-10-15 MED ORDER — COCONUT OIL OIL: 1.0000 "application " | TOPICAL_OIL | Status: DC | PRN

## 2018-10-15 MED ORDER — PRENATAL MULTIVITAMIN CH: 1.0000 | ORAL_TABLET | Freq: Every day | ORAL | Status: DC

## 2018-10-15 MED ORDER — PHENYLEPHRINE 40 MCG/ML (10ML) SYRINGE FOR IV PUSH (FOR BLOOD PRESSURE SUPPORT): 80.0000 ug | PREFILLED_SYRINGE | INTRAVENOUS | Status: DC | PRN

## 2018-10-15 MED ORDER — LIDOCAINE HCL (PF) 1 % IJ SOLN: 30.0000 mL | INTRAMUSCULAR | Status: DC | PRN

## 2018-10-15 MED ORDER — OXYTOCIN 40 UNITS IN LACTATED RINGERS INFUSION - SIMPLE MED
INTRAVENOUS | Status: AC
  Administered 2018-10-15: 500 mL via INTRAVENOUS
  Filled 2018-10-15: qty 1000

## 2018-10-15 MED ORDER — SODIUM CHLORIDE 0.9 % IV SOLN
INTRAVENOUS | Status: DC | PRN
  Administered 2018-10-15 (×2): 5 mL via EPIDURAL

## 2018-10-15 MED ORDER — LACTATED RINGERS IV SOLN
INTRAVENOUS | Status: DC
  Administered 2018-10-15: 09:00:00 via INTRAVENOUS

## 2018-10-15 MED ORDER — LACTATED RINGERS IV SOLN: 500.0000 mL | INTRAVENOUS | Status: DC | PRN

## 2018-10-15 MED ORDER — ACETAMINOPHEN 325 MG PO TABS: 650.0000 mg | ORAL_TABLET | ORAL | Status: DC | PRN

## 2018-10-15 MED ORDER — DIBUCAINE 1 % RE OINT: 1.0000 "application " | TOPICAL_OINTMENT | RECTAL | Status: DC | PRN

## 2018-10-15 MED ORDER — DOCUSATE SODIUM 100 MG PO CAPS: 100.0000 mg | ORAL_CAPSULE | Freq: Two times a day (BID) | ORAL | Status: DC

## 2018-10-15 NOTE — Progress Notes (Signed)
   10/15/18 1120  Clinical Encounter Type  Visited With Patient and family together  Visit Type Follow-up (order regarding fetal loss)  Referral From Nurse  Spiritual Encounters  Spiritual Needs Emotional   Chaplain responded to OR regarding fetal loss.  Patient indicated that she wanted to rest.  Another family member sleeping on couch.  Chaplain spoke to patient's mother and other support person regarding ongoing support.  Chaplain will continue to follow, and also encouraged family to reach out as needed.

## 2018-10-15 NOTE — Anesthesia Procedure Notes (Signed)
Epidural Patient location during procedure: OB Start time: 10/15/2018 10:10 AM End time: 10/15/2018 10:17 AM  Staffing Anesthesiologist: Lenard SimmerKarenz, Murielle Stang, MD Performed: anesthesiologist   Preanesthetic Checklist Completed: patient identified, site marked, surgical consent, pre-op evaluation, timeout performed, IV checked, risks and benefits discussed and monitors and equipment checked  Epidural Patient position: sitting Prep: ChloraPrep Patient monitoring: heart rate, continuous pulse ox and blood pressure Approach: midline Location: L3-L4 Injection technique: LOR saline  Needle:  Needle type: Tuohy  Needle gauge: 17 G Needle length: 9 cm and 9 Needle insertion depth: 5 cm Catheter type: closed end flexible Catheter size: 19 Gauge Catheter at skin depth: 10 cm Test dose: negative and 1.5% lidocaine with Epi 1:200 K  Assessment Sensory level: T10 Events: blood not aspirated, injection not painful, no injection resistance, negative IV test and no paresthesia  Additional Notes 1st attempt Pt. Evaluated and documentation done after procedure finished. Patient identified. Risks/Benefits/Options discussed with patient including but not limited to bleeding, infection, nerve damage, paralysis, failed block, incomplete pain control, headache, blood pressure changes, nausea, vomiting, reactions to medication both or allergic, itching and postpartum back pain. Confirmed with bedside nurse the patient's most recent platelet count. Confirmed with patient that they are not currently taking any anticoagulation, have any bleeding history or any family history of bleeding disorders. Patient expressed understanding and wished to proceed. All questions were answered. Sterile technique was used throughout the entire procedure. Please see nursing notes for vital signs. Test dose was given through epidural catheter and negative prior to continuing to dose epidural or start infusion. Warning signs of  high block given to the patient including shortness of breath, tingling/numbness in hands, complete motor block, or any concerning symptoms with instructions to call for help. Patient was given instructions on fall risk and not to get out of bed. All questions and concerns addressed with instructions to call with any issues or inadequate analgesia.   Patient tolerated the insertion well without immediate complications.Reason for block:procedure for pain

## 2018-10-15 NOTE — Progress Notes (Signed)
Discussed with patient option for fetal autopsy including photographs, Xrays, US, MRI, and total fetal autopsy. She also declines karyotype.  Patient is declining all of these options. Discussed that this could be beneficial for future pregnancies but she declines.

## 2018-10-15 NOTE — Progress Notes (Signed)
L&D progress Note   Natalie Pierce presented to L&D with complaints of contractions that started this morning. CTSP because she could not find fetal heart tones with DT. Ultrasound used to find fetal heart. There was no fetal cardiac activity detected and no color flow to the heart.  Intrauterine fetal demise was confirmed by ultrasound by Dr Jerene PitchSchuman.  Cervix: 6/C by RN. Patient was appropriately distressed Patient will be admitted for delivery.  Natalie Pierce, CNM

## 2018-10-15 NOTE — Progress Notes (Signed)
   10/15/18 0900  Clinical Encounter Type  Visited With Patient;Family (Baby's father Thurston Poundsrey, Mother Cherly HensenBernadette)  Visit Type Initial;Spiritual support;Death  Referral From Nurse  Consult/Referral To Chaplain Kingsley Spittle(Chaplain Gibbs)  Recommendations Follow-up needed.  Spiritual Encounters  Spiritual Needs Emotional;Prayer  Stress Factors  Patient Stress Factors  (Fetal Demise)  Family Stress Factors  (Death of son, grandson)   Chaplain responded to page for fetal demise in Observation Room 2. Chaplain encountered the patient, the child's father Thurston Poundsrey, and her mother Cherly HensenBernadette. The family was experiencing profound grief. Chaplain offered prayer, presence, and active listening for the family. Pastoral care was transferred to [facility].

## 2018-10-15 NOTE — H&P (Signed)
H&P  Natalie Pierce is an 22 y.o. female.  HPI: She presented today to triage for labor evaluation but was found ha vae an IUFD. This was [facility]. She denies recent illness. She denies drug usage. She denies abdominal trauma. She denies vaginal bleeding. She has not had any complications or issues this pregnancy.    Pregnancy#2 Problems (from 01/14/18 to present)    Problem Noted Resolved   Supervision of other normal pregnancy, antepartum 03/14/2018 by Tresea MallGledhill, Jane, CNM No   Overview Addendum 09/30/2018  4:27 PM by Farrel ConnersGutierrez, Colleen, CNM    Clinic [facility] Prenatal Labs  Dating  LMP =10 week US Blood type: O/Positive/-- (06/14 1640)   Genetic Screen     NIPS: normal XX Antibody:Negative (06/14 1640)  Anatomic US complete Rubella: 7.37 (06/14 1640) Varicella: Immune  GTT Third trimester: 78 RPR: Non Reactive (06/14 1640)   Rhogam Not needed HBsAg: Negative (06/14 1640)   TDaP vaccine                        Flu Shot: Declined HIV: Non Reactive (06/14 1640)   Baby Food  Breast                               GBS: neg  Contraception  Pap: NIL  CBB     CS/VBAC NA   Support Person  Boyfriend Trayvance               Past Medical History:  Diagnosis Date  . Anemia     Past Surgical History:  Procedure Laterality Date  . WISDOM TOOTH EXTRACTION      History reviewed. No pertinent family history.  Social History:  reports that she has never smoked. She has never used smokeless tobacco. She reports current alcohol use. She reports that she does not use drugs.  Allergies: No Known Allergies  Medications: I have reviewed the patient's current medications.  Results for orders placed or performed during the hospital encounter of 10/15/18 (from the past 48 hour(s))  CBC     Status: Abnormal   Collection Time: 10/15/18  8:55 AM  Result Value Ref Range   WBC 10.7 (H) 4.0 - 10.5 K/uL   RBC 3.97 3.87 - 5.11 MIL/uL   Hemoglobin 10.0 (L)  12.0 - 15.0 g/dL   HCT 16.131.6 (L) 09.636.0 - 04.546.0 %   MCV 79.6 (L) 80.0 - 100.0 fL   MCH 25.2 (L) 26.0 - 34.0 pg   MCHC 31.6 30.0 - 36.0 g/dL   RDW 40.916.3 (H) 81.111.5 - 91.415.5 %   Platelets 285 150 - 400 K/uL   nRBC 0.3 (H) 0.0 - 0.2 %    Comment: Performed at [facility], [address] [place], [place] [address]  Protime-INR     Status: None   Collection Time: 10/15/18  8:55 AM  Result Value Ref Range   Prothrombin Time 12.8 11.4 - 15.2 seconds   INR 0.97     Comment: Performed at [facility], [address] [place], [place] [address]  APTT     Status: None   Collection Time: 10/15/18  8:55 AM  Result Value Ref Range   aPTT 25 24 - 36 seconds    Comment: Performed at [facility], [address] [place], [place] [address]  Fibrinogen     Status:  Abnormal   Collection Time: 10/15/18  8:55 AM  Result Value Ref Range   Fibrinogen 681 (H) 210 - 475 mg/dL    Comment: Performed at [facility], [address] [place], [place] [address]  Kleihauer-Betke stain     Status: None   Collection Time: 10/15/18  8:55 AM  Result Value Ref Range   Fetal Cells % 0 %   Quantitation Fetal Hemoglobin 0.0000 mL    Comment: UP TO 15mL   # Vials RhIg NOT INDICATED     Comment: Performed at [facility], [address] [place], [place] [address]  Rapid HIV screen [facility] L&D dept ONLY)     Status: None   Collection Time: 10/15/18  8:55 AM  Result Value Ref Range   HIV-1 P24 Antigen - HIV24 NON REACTIVE NON REACTIVE   HIV 1/2 Antibodies NON REACTIVE NON REACTIVE   Interpretation (HIV Ag Ab)      A non reactive test result means that HIV 1 or HIV 2 antibodies and HIV 1 p24 antigen were not detected in the specimen.    Comment: Performed at [facility], [address] [place], [place] [address]  T4, free     Status: Abnormal   Collection Time: 10/15/18  8:55 AM  Result Value Ref Range   Free T4 0.65 (L) 0.82 - 1.77 ng/dL    Comment:  (NOTE) Biotin ingestion may interfere with free T4 tests. If the results are inconsistent with the TSH level, previous test results, or the clinical presentation, then consider biotin interference. If needed, order repeat testing after stopping biotin. Performed at [facility], [address] [place], [place] [address]   TSH     Status: None   Collection Time: 10/15/18  8:55 AM  Result Value Ref Range   TSH 2.505 0.350 - 4.500 uIU/mL    Comment: Performed by a 3rd Generation assay with a functional sensitivity of <=0.01 uIU/mL. Performed at [facility], [address] [place], [place] [address]   Urine Drug Screen, Qualitative [facility] only)     Status: None   Collection Time: 10/15/18  9:53 AM  Result Value Ref Range   Tricyclic, Ur Screen NONE DETECTED NONE DETECTED   Amphetamines, Ur Screen NONE DETECTED NONE DETECTED   MDMA (Ecstasy)Ur Screen NONE DETECTED NONE DETECTED   Cocaine Metabolite,Ur [place] NONE DETECTED NONE DETECTED   Opiate, Ur Screen NONE DETECTED NONE DETECTED   Phencyclidine (PCP) Ur S NONE DETECTED NONE DETECTED   Cannabinoid 50 Ng, Ur [place] NONE DETECTED NONE DETECTED   Barbiturates, Ur Screen NONE DETECTED NONE DETECTED   Benzodiazepine, Ur Scrn NONE DETECTED NONE DETECTED   Methadone Scn, Ur NONE DETECTED NONE DETECTED    Comment: (NOTE) Tricyclics + metabolites, urine    Cutoff 1000 ng/mL Amphetamines + metabolites, urine  Cutoff 1000 ng/mL MDMA (Ecstasy), urine              Cutoff 500 ng/mL Cocaine Metabolite, urine          Cutoff 300 ng/mL Opiate + metabolites, urine        Cutoff 300 ng/mL Phencyclidine (PCP), urine         Cutoff 25 ng/mL Cannabinoid, urine                 Cutoff 50 ng/mL Barbiturates + metabolites, urine  Cutoff 200 ng/mL Benzodiazepine, urine              Cutoff 200  ng/mL Methadone, urine                   Cutoff 300 ng/mL The urine drug screen provides only a preliminary, unconfirmed analytical test result and  should not be used for non-medical purposes. Clinical consideration and professional judgment should be applied to any positive drug screen result due to possible interfering substances. A more specific alternate chemical method must be used in order to obtain a confirmed analytical result. Gas chromatography / mass spectrometry (GC/MS) is the preferred confirmat ory method. Performed at [facility], [address] [place], [place] [address]   Type and screen     Status: None   Collection Time: 10/15/18 10:55 AM  Result Value Ref Range   ABO/RH(D) O POS    Antibody Screen NEG    Sample Expiration      10/18/2018 Performed at [facility], [address] [place], [place] [address]     No results found.  ROS Blood pressure 116/65, pulse (!) 106, temperature 98.6 F (37 C), temperature source Oral, resp. rate 16, height 5\' 5"  (1.651 m), weight 93 kg, last menstrual period 01/14/2018, SpO2 100 %. Physical Exam  Assessment/Plan: 22 yo G2P1001 8254w1d with IUFD 1. Will keep patient for expectant management of labor 2. IUFD- labs ordered to evaluate for abruption, UDS, RPR, Lupus anticoagulant, parvo IgG and IgM 3. Epidural when desired 4. Condolences given to patient and her significant other.  [name] 10/15/2018, 2:51 PM

## 2018-10-15 NOTE — Progress Notes (Signed)
   10/15/18 1030  Clinical Encounter Type  Visited With Health care provider  Visit Type Follow-up   Checked in with staff regarding follow up for patient.  Staff reported that patient is receiving an epidural.  Chaplain to check back later, encouraged staff to page for chaplain as patient/family need.

## 2018-10-15 NOTE — Progress Notes (Signed)
   10/15/18 2005  Clinical Encounter Type  Visited With Patient and family together;Health care provider  Visit Type Follow-up  Spiritual Encounters  Spiritual Needs Emotional   Chaplain checked in with patient's nurse regarding timing of visit before following up with patient and family.  Family was sharing a meal, patient's daughter in bassinet at bedside. Chaplain offered emotional support and condolences.  Family reported no needs at present.  Chaplain let them know that she would be available throughout the night if they needed and encouraged them to have staff page if needs changed.

## 2018-10-15 NOTE — Anesthesia Preprocedure Evaluation (Signed)
Anesthesia Evaluation  Patient identified by MRN, date of birth, ID band Patient awake    Reviewed: Allergy & Precautions, H&P , NPO status , Patient's Chart, lab work & pertinent test results, reviewed documented beta blocker date and time   History of Anesthesia Complications Negative for: history of anesthetic complications  Airway Mallampati: II  TM Distance: >3 FB Neck ROM: full    Dental no notable dental hx.    Pulmonary neg pulmonary ROS,           Cardiovascular Exercise Tolerance: Good negative cardio ROS       Neuro/Psych negative neurological ROS  negative psych ROS   GI/Hepatic negative GI ROS, Neg liver ROS,   Endo/Other  negative endocrine ROS  Renal/GU negative Renal ROS  negative genitourinary   Musculoskeletal   Abdominal   Peds  Hematology  (+) Blood dyscrasia, anemia ,   Anesthesia Other Findings Past Medical History: No date: Anemia   Reproductive/Obstetrics (+) Pregnancy                             Anesthesia Physical Anesthesia Plan  ASA: II  Anesthesia Plan: Epidural   Post-op Pain Management:    Induction:   PONV Risk Score and Plan:   Airway Management Planned:   Additional Equipment:   Intra-op Plan:   Post-operative Plan:   Informed Consent: I have reviewed the patients History and Physical, chart, labs and discussed the procedure including the risks, benefits and alternatives for the proposed anesthesia with the patient or authorized representative who has indicated his/her understanding and acceptance.   Dental Advisory Given  Plan Discussed with: Anesthesiologist, CRNA and Surgeon  Anesthesia Plan Comments:         Anesthesia Quick Evaluation

## 2018-10-15 NOTE — H&P (Signed)
OB History & Physical   History of Present Illness:  Chief Complaint: contractions  HPI:  Sherie Donmoni D Keeling is a 22 y.o. G2P1001 female at 191w1d dated by LMP.  Her pregnancy has been uncomplicated.    She reports contractions.   She denies leakage of fluid.   She denies vaginal bleeding.   She reports last known fetal movement at about 6 AM this morning.   On arrival to triage at 7 AM, RN was attempting to assess fetal heart tones and was unable to locate heart tones. CNM Sharen HonesGutierrez was called to assess by ultrasound. No heart beat visualized. Fetal demise confirmed by MD Schuman by ultrasound. Patient and her family were visited by Bahamashaplain. The patient was moved to labor room when she was found to be 6 cm dilated. Patient requested epidural for pain control. She has good support around her and the family is coping well through their grief.     Maternal Medical History:   Past Medical History:  Diagnosis Date  . Anemia     Past Surgical History:  Procedure Laterality Date  . WISDOM TOOTH EXTRACTION      No Known Allergies  Prior to Admission medications   Medication Sig Start Date End Date Taking? Authorizing Provider  ferrous sulfate (FERROUSUL) 325 (65 FE) MG tablet Take 1 tablet (325 mg total) by mouth 2 (two) times daily. 07/30/18  Yes Schuman, Jaquelyn Bitterhristanna R, MD    OB History  Gravida Para Term Preterm AB Living  2 1 1     1   SAB TAB Ectopic Multiple Live Births          1    # Outcome Date GA Lbr Len/2nd Weight Sex Delivery Anes PTL Lv  2 Current           1 Term 10/05/13 5180w2d  3487 g M Vag-Spont   LIV    Prenatal care site: [facility]  Social History: She  reports that she has never smoked. She has never used smokeless tobacco. She reports current alcohol use. She reports that she does not use drugs.  Family History:   She does not have a family history of gynecologic cancer  Review of Systems: Negative x 10 systems reviewed except as noted in the HPI.     Physical Exam:  Vital Signs: BP 116/61   Pulse (!) 114   Temp 98.6 F (37 C) (Oral)   Resp 16   Ht 5\' 5"  (1.651 m)   Wt 93 kg   LMP 01/14/2018 (Exact Date)   SpO2 100%   BMI 34.11 kg/m  Constitutional: Well nourished, well developed female in no acute distress.  HEENT: normal Skin: Warm and dry.  Cardiovascular: Regular rate and rhythm.   Extremity: no edema  Respiratory: Clear to auscultation bilateral. Normal respiratory effort Abdomen: soft, nontender, nondistended, no abnormal masses, no epigastric pain Back: no CVAT Neuro: DTRs 2+, Cranial nerves grossly intact Psych: Alert and Oriented x3. No memory deficits. Normal mood and affect.  MS: normal gait, normal bilateral lower extremity ROM/strength/stability.  Pelvic exam: exam by RN, 6/100/-2   Pertinent Results:  Prenatal Labs: Blood type/Rh O positive  Antibody screen negative  Rubella Immune  Varicella Immune    RPR Non-reactive  HBsAg negative  HIV negative  GC negative  Chlamydia negative  Genetic screening Negative  1 hour GTT 78  3 hour GTT NA  GBS negative on 12/13   Results for Sherie DonBRADSHER, Emmelia D (MRN [id number]) as of  10/15/2018 12:13  Ref. Range 10/15/2018 08:55 10/15/2018 09:53 10/15/2018 10:55  WBC Latest Ref Range: 4.0 - 10.5 K/uL 10.7 (H)    RBC Latest Ref Range: 3.87 - 5.11 MIL/uL 3.97    Hemoglobin Latest Ref Range: 12.0 - 15.0 g/dL 47.810.0 (L)    HCT Latest Ref Range: 36.0 - 46.0 % 31.6 (L)    MCV Latest Ref Range: 80.0 - 100.0 fL 79.6 (L)    MCH Latest Ref Range: 26.0 - 34.0 pg 25.2 (L)    MCHC Latest Ref Range: 30.0 - 36.0 g/dL 29.531.6    RDW Latest Ref Range: 11.5 - 15.5 % 16.3 (H)    Platelets Latest Ref Range: 150 - 400 K/uL 285    nRBC Latest Ref Range: 0.0 - 0.2 % 0.3 (H)    Fibrinogen Latest Ref Range: 210 - 475 mg/dL 621681 (H)    Prothrombin Time Latest Ref Range: 11.4 - 15.2 seconds 12.8    INR Unknown 0.97    APTT Latest Ref Range: 24 - 36 seconds 25    TSH Latest Ref Range: 0.350  - 4.500 uIU/mL 2.505    T4,Free(Direct) Latest Ref Range: 0.82 - 1.77 ng/dL 3.080.65 (L)    Sample Expiration Unknown   10/18/2018...  Antibody Screen Unknown   NEG  ABO/RH(D) Unknown   O POS  HIV 1/2 Antibodies Latest Ref Range: NON REACTIVE  NON REACTIVE    Interpretation (HIV Ag Ab) Unknown A non reactive test result means that HIV 1 or HIV 2 antibodies and HIV 1 p24 antigen were not de...    HIV-1 P24 Antigen - HIV24 Latest Ref Range: NON REACTIVE  NON REACTIVE    Amphetamines, Ur Screen Latest Ref Range: NONE DETECTED   NONE DETECTED   Barbiturates, Ur Screen Latest Ref Range: NONE DETECTED   NONE DETECTED   Benzodiazepine, Ur Scrn Latest Ref Range: NONE DETECTED   NONE DETECTED   Cocaine Metabolite,Ur [place] Latest Ref Range: NONE DETECTED   NONE DETECTED   Methadone Scn, Ur Latest Ref Range: NONE DETECTED   NONE DETECTED   MDMA (Ecstasy)Ur Screen Latest Ref Range: NONE DETECTED   NONE DETECTED   Cannabinoid 50 Ng, Ur [place] Latest Ref Range: NONE DETECTED   NONE DETECTED   Opiate, Ur Screen Latest Ref Range: NONE DETECTED   NONE DETECTED   Phencyclidine (PCP) Ur S Latest Ref Range: NONE DETECTED   NONE DETECTED   Tricyclic, Ur Screen Latest Ref Range: NONE DETECTED   NONE DETECTED   URINE CULTURE Unknown  Rpt     Contractions: present frequency: every 1-4 min   Bedside Ultrasound: no fetal heart tones seen/heard    Assessment:  Treyana Erin FullingD Shivley is a 22 y.o. 242P1001 female at 1968w1d with IUFD/active labor.   Plan:  1. Admit to Labor & Delivery  2. CBC, T&S, Clrs, IVF 3. IV analgesia PRN, epidural following labs   4. IUFD protocol 5. Expectant management for vaginal delivery  Tresea MallJane Maddison Kilner, [facility] 10/15/2018 11:56 AM

## 2018-10-16 LAB — CBC
HCT: 27.5 % — ABNORMAL LOW (ref 36.0–46.0)
Hemoglobin: 8.4 g/dL — ABNORMAL LOW (ref 12.0–15.0)
MCH: 24.9 pg — AB (ref 26.0–34.0)
MCHC: 30.5 g/dL (ref 30.0–36.0)
MCV: 81.6 fL (ref 80.0–100.0)
Platelets: 243 10*3/uL (ref 150–400)
RBC: 3.37 MIL/uL — ABNORMAL LOW (ref 3.87–5.11)
RDW: 16.5 % — ABNORMAL HIGH (ref 11.5–15.5)
WBC: 10.7 10*3/uL — ABNORMAL HIGH (ref 4.0–10.5)
nRBC: 0.3 % — ABNORMAL HIGH (ref 0.0–0.2)

## 2018-10-16 LAB — RPR: RPR Ser Ql: NONREACTIVE

## 2018-10-16 LAB — URINE CULTURE: Culture: <10000 — AB

## 2018-10-16 NOTE — Discharge Summary (Signed)
OB Discharge Summary     Patient Name: Natalie Pierce DOB: 08/09/1996 MRN: [id number]  Date of admission: 10/15/2018 Delivering MD: Adelene Idlerhristanna Schuman, MD Date of Delivery: 10/15/2018  Date of discharge: 10/16/2018  Admitting diagnosis: Pregnancy - Contractions Intrauterine pregnancy: 6974w1d     Secondary diagnosis: IUFD     Discharge diagnosis: Term Pregnancy Delivered, IUFD                         Hospital course:  Onset of Labor With Vaginal Delivery     23 y.o. yo G2P2001 at 7874w1d was admitted in Active Labor on 10/15/2018. Patient had a labor course as follows:  Membrane Rupture Time/Date: 12:18 PM ,10/15/2018   Intrapartum Procedures: Episiotomy: None [1]                                         Lacerations:  1st degree [2]  Patient had a delivery of a Non Viable infant. 10/15/2018  Information for the patient's newborn:  Laray AngerBradsher, PendingBaby FD [[id number]][[id number]]  Delivery Method: Vaginal, Spontaneous(Filed from Delivery Summary)    Patient is ambulating, tolerating a regular diet, and urinating well. She is appropriately distressed for her loss. Patient is discharged home in stable condition on 10/16/18.                                                                 Post partum procedures: none  Complications: Stillborn  Physical exam on 10/16/2018: Vitals:   10/15/18 1909 10/15/18 2303 10/16/18 0503 10/16/18 1105  BP: 119/71 126/80 123/63 116/69  Pulse: 89 95 88 82  Resp: 20 18 16    Temp: 98.3 F (36.8 C) 97.9 F (36.6 C) 97.7 F (36.5 C)   TempSrc: Oral Oral Oral   SpO2:      Weight:      Height:       General: cooperative Lochia: appropriate Uterine Fundus: firm Incision: N/A DVT Evaluation: No evidence of DVT seen on physical exam.  Labs: Lab Results  Component Value Date   WBC 10.7 (H) 10/16/2018   HGB 8.4 (L) 10/16/2018   HCT 27.5 (L) 10/16/2018   MCV 81.6 10/16/2018   PLT 243 10/16/2018   CMP Latest Ref Rng & Units 05/16/2016  Glucose 65 - 99 mg/dL  97  BUN 6 - 20 mg/dL 10  Creatinine 7.820.44 - 9.561.00 mg/dL 2.130.64  Sodium 086135 - 578145 mmol/L 139  Potassium 3.5 - 5.1 mmol/L 3.7  Chloride 101 - 111 mmol/L 106  CO2 22 - 32 mmol/L 26  Calcium 8.9 - 10.3 mg/dL 9.3  Total Protein 6.5 - 8.1 g/dL 7.6  Total Bilirubin 0.3 - 1.2 mg/dL 0.6  Alkaline Phos 38 - 126 U/L 65  AST 15 - 41 U/L 14(L)  ALT 14 - 54 U/L 9(L)    Discharge instruction: per After Visit Summary.  Medications:  Allergies as of 10/16/2018   No Known Allergies     Medication List    TAKE these medications   ferrous sulfate 325 (65 FE) MG tablet Commonly known as:  FERROUSUL Take 1 tablet (325 mg total) by mouth 2 (two) times daily.  Diet: routine diet  Activity: Advance as tolerated. Pelvic rest for 6 weeks.   Outpatient follow up: Follow-up Information    Schuman, Jaquelyn Bitter, MD. Schedule an appointment as soon as possible for a visit in 2 week(s).   Specialty:  Obstetrics and Gynecology Contact information: [address] [place] [address] [phone]             Postpartum contraception: Nexplanon Rhogam Given postpartum: no Rubella vaccine given postpartum: no Varicella vaccine given postpartum: no TDaP given antepartum or postpartum: no  Newborn Data: Stillborn female  Birth Weight: 6 lb 7.7 oz (2939 g) APGAR: 0, 0  Newborn Delivery   Birth date/time:  10/15/2018 13:45:00 Delivery type:  Vaginal, Spontaneous     SIGNED:  Oswaldo Conroy, CNM 10/16/2018 5:26 PM

## 2018-10-17 ENCOUNTER — Encounter: Payer: Medicaid Other | Admitting: Certified Nurse Midwife

## 2018-10-17 LAB — LUPUS ANTICOAGULANT
DRVVT: 27 s (ref 0.0–47.0)
PTT Lupus Anticoagulant: 29.2 s (ref 0.0–51.9)
Thrombin Time: 15 s (ref 0.0–23.0)
dPT Confirm Ratio: 0.97 Ratio (ref 0.00–1.40)
dPT: 29.1 s (ref 0.0–55.0)

## 2018-10-17 LAB — PARVOVIRUS B19 ANTIBODY, IGG AND IGM
Parovirus B19 IgG Abs: 6.5 index — ABNORMAL HIGH (ref 0.0–0.8)
Parovirus B19 IgM Abs: 0.1 index (ref 0.0–0.8)

## 2018-10-17 NOTE — Progress Notes (Signed)
Please discuss with patient at her visit today. Normal labs.

## 2018-10-18 LAB — CARDIOLIPIN ANTIBODIES, IGM+IGG
Anticardiolipin IgG: <9 GPL U/mL (ref 0–14)
Anticardiolipin IgM: <9 MPL U/mL (ref 0–12)

## 2018-10-21 ENCOUNTER — Inpatient Hospital Stay: Admission: RE | Admit: 2018-10-21 | Payer: Self-pay | Source: Home / Self Care

## 2018-10-25 ENCOUNTER — Ambulatory Visit (INDEPENDENT_AMBULATORY_CARE_PROVIDER_SITE_OTHER): Payer: Medicaid Other | Admitting: Obstetrics and Gynecology

## 2018-10-25 ENCOUNTER — Encounter: Payer: Self-pay | Admitting: Obstetrics and Gynecology

## 2018-10-25 NOTE — Progress Notes (Signed)
  OBSTETRICS POSTPARTUM CLINIC PROGRESS NOTE  Subjective:     Natalie Pierce is a 23 y.o. G17P2001 female who presents for a postpartum visit. She is 1 week postpartum following a stillbirth and delivery by Vaginal, no problems at delivery.  I have fully reviewed the prenatal and intrapartum course.  Postpartum course has been complicated by uncomplicated.  Baby is feeding by  .  Bleeding: patient has not  resumed menses.  Bowel function is normal. Bladder function is normal.  Patient is not sexually active. Postpartum depression screening: negative. [place] 5.  The following portions of the patient's history were reviewed and updated as appropriate: allergies, current medications, past family history, past medical history, past social history, past surgical history and problem list.  Review of Systems Pertinent items are noted in HPI.  Objective:    BP 112/80   Pulse 72   Ht 5\' 6"  (1.676 m)   Wt 184 lb (83.5 kg)   LMP 01/14/2018 (Exact Date)   BMI 29.70 kg/m   General:  alert and no distress   Breasts:  inspection negative, no nipple discharge or bleeding, no masses or nodularity palpable  Lungs: clear to auscultation bilaterally  Heart:  regular rate and rhythm, S1, S2 normal, no murmur, click, rub or gallop  Abdomen: soft, non-tender; bowel sounds normal; no masses,  no organomegaly.     Vulva:  normal  Vagina: normal vagina, no discharge, exudate, lesion, or erythema  Cervix:  no cervical motion tenderness and no lesions  Corpus: normal size, contour, position, consistency, mobility, non-tender  Adnexa:  normal adnexa and no mass, fullness, tenderness  Rectal Exam: Not performed.          Assessment:  Post Partum Care visit There are no diagnoses linked to this encounter.  Plan:  See orders and Patient Instructions Follow up in: 4 weeks or as needed.   Offered condolences Reviewed placenta pathology report Patients [place] 5 Given reading resources and list  of therapist Called cremation services for information about remains. They should be available on the 13th or 14th. Natalie Pierce is aware.  [name] MD [facility], [facility] 10/29/2018 8:07 AM

## 2018-10-25 NOTE — Patient Instructions (Signed)
Therapists/Counselors/Psychologists  MJ Tucci  [phone] HOSPICE   Karen [place], [place]  & Jacqlyn Krauss [place]    [phone]        [address]       [place], [place] [address]        Ival Bible, CSW [phone] [address] [place], [place] [address]  Harle Battiest, [place]        [phone]        [address]      [place], [place] [address]        Chyrel Masson, MS [phone] [address] [place], [place] [address]   Oscar La, LMFT       [phone]        [address]       [place], [place] [address]        Felecia Jan [phone] [address] [place], [place] [address]  Lester [place]        [phone]        [address]       [place], [place] [address]        Kerin Salen [phone] [address] [place], [place] [address]  Tyron Russell, PsyD       [phone]        [address]       [place], [place] [address]        Elita Quick, LPC [phone] [address]  [place], [place] [address]   Debarah Crape        [phone]        [address] [place], [place] [address]         Rosana Hoes [facility] [phone] [email or web address]   Sation Konchella       [phone]        [address]       [place], [place] [address]        Morton Stall [facility] [phone] [email or web address]     Please accept our heartfelt condolences and call us if you need an ear to listen.  Resources: [email or web address] This site has an excellent pamphlet that you can download on early pregnancy loss.  Miscarriage: Women Sharing from the Heart by Lorre Munroe and Ellyn Hack. Published by [employer]. Summarizes reactions of 100 women who experience miscarriage. Molly's [place] by Baldemar Lenis. Published by  [employer]. Children's book on miscarriage. Miscarriage: A Man's Book by Philippa Sicks. Published by [employer] at [phone]. Written to help men understand reactions of both parents to miscarriage. Miscarriage: A Shattered Dream by Peri Maris and Lubertha Sayres. I Never Held You: A book about miscarriage, healing and recovery by Armen Pickup. [place].   MISCARRIAGE OR NEONATAL LOSS.a word to parents By Carmie End, MSW For those who experience a miscarriage or fetal loss, the world has turned upside down. Regardless of whether the pregnancy was planned and wanted, or unplanned and the parents were ambivalent, there are feelings of sadness, grief and a loss of equilibrium which effects both parents. Though we allow for these feelings when other deaths occur, we are remiss in acknowledging and allowing parents to grieve their pregnancy loss or miscarriage. We wrongly equate the depth of the loss to the size of the casket. With pregnancy loss or death before birth, there is little guidance  or expectation of what is normal grief. Others, who minimize or misunderstand the loss, may expect that you "get back to normal" quickly. With pressures to overlook the impact of the loss, healthy grieving processes can get stunted, overlooked, or stopped all together. I hope that this handout will allow you to acknowledge and express whatever loss you feel, to share with others your sorrow and will encourage you and others to take a moment to understand and experience the depth of your emotions at this time.  A baby represents so much. It may represent hope, immortality, fulfillment of dreams, or an outward sign of a loving relationship. At the same time, pregnancy may bring on anxiety, fear, and an increase in commitment and responsibility. Understandably, few pregnant parents experience only one or the other of these emotions. Despite the type of emotional response to the pregnancy, as the  process continues, what is central to almost all pregnancies is that over time, the bond between parent and child grows. Each day brings an expansion of the world. What was once routine now becomes more important. Diet, lifestyle, connection to family, time commitments, relationships with friends, spouse, others.nothing is as it was. These are the normal changes that a pregnant woman and her partner experience. When a baby dies, nothing that made sense before makes any sense after. We are left emotionally raw. Often our bodies respond in ways that assure [place] that we are crazy, but we are not. We are grieving a life that we never fully knew, a relationship that was too short. A part of [place] has died too. Mothers and fathers become more attached to a pregnancy as each day, week and month go by. Mothers have physical contact with the pregnancy, daily reminders of the growth and change occurring. They connect by talking to their bellies, by wondering what life will be like this time next year, look for clothes, cribs, pediatricians and more. Fathers often become attached in different ways: considering buying a new car or safe car seat, working more to earn a little more money before the baby arrives, or by reconsidering the family's financial status. However bonding occurs, it generally increases as the baby grows. For family members, co-workers and friends, the pregnancy may be less real. They may only know of the pregnancy from a physical or emotional distance. Since many of [place] are so uncomfortable with death and we work hard to deny it. With a prenatal loss, the lack of contact with the unborn baby may encourage others to minimize the loss, or even suggest that it was something to feel thankful for at this time suggesting that the pain felt now would be significantly less than at any other time in relationship to this child. That is why it feels so crazy to be in such pain when others might not  understand. You might feel that you are still standing at an emotional graveside and the world wonders "when you'll be ready to go back to work" or "why you still cry. It's been four months already." Be assured that it is the rest of the world that is, in this case, acting crazy. When a living child or adult dies, we have understandings, behaviors, rituals that though painful, enable [place] to feel cared for, comforted, acknowledged in our loss, and encourage healing. Unfortunately, too little of this is the case when there is a miscarriage or neonatal loss. There are many difficult dilemmas that you might face in the wake of the loss.  Do you have a funeral? How do you tell people? Do you publish an obituary? What do you do with the remains, both physical and emotional? I encourage people to find a formalized way to acknowledge the loss of the pregnancy and to honor the relationship that did and does still exist and will for the rest of your life. It is your choice as to whether this happens in a funeral service, in a [employer], in a letter to the child who you did not meet or in some manner that makes sense to you religiously, spiritually or emotionally. It is not silly to ask friends or family to participate nor is it improper to keep this intensely personal and private. Grief, however, needs to be a public process. We must grieve among others and with our loved ones. We do not all need to feel the same amount of pain in the loss, but we need to act as a loving community to support those most hurt at this time. Healthy grieving involves a strengthening of the bonds with others who we love and who love [place]. Some people ask a close friend or relative to contact others and inform them of the loss to minimize the telling of the story. Others find relief in telling friends, family and colleagues about the loss, in that it reminds [place] that the pregnancy was real and that this pain is real. Where  one connection is broken, another is reinforced and strengthened. Grieving in isolation can be detrimental. By inviting important others to a funeral, tree planting, or sunset service and by taking them up on an offer to help will help your healing. Some people have described aspects of grief after a pregnancy loss that are quite different from those present after the loss of an adult relative or friend. Some things that might seem "weird" or "abnormal" are indeed, quite normal following this kind of loss. Alberteen Spindle described her experience in the book "Understanding: Death of the Wished-for Child." "I kept searching for something. I wasn't sure what it was. All of a sudden one day in the kitchen, I spontaneously got out my kitchen scales and started weighing fruits and vegetables. I realized I was trying to find something that had the identical weight that the baby did.I found myself weighing my rolling pin.it happened to be the identical length and weight that the baby was." This describes the process of building memories. With miscarriage and neonatal loss, there are too few memories of the pregnancy or the baby itself. Parents may not ever be able to see or hold a baby in these cases, especially in the earlier trimesters, so saving mementos and gathering and providing information are especially important. Alberteen Spindle described a parent who needs to see and feel something that had similar dimensions as the child. This is profoundly different than the grieving that happens when a 24 year old person dies, when we have years of memories, concrete possessions, photos and history. Though it is a different aspect of grief, it is normal, expected and healthy. Your grief will not make you crazy. Also, do not be frightened if you feel this loss in a very physical manner. Your arms or chest may ache, convincing you that your heart has broken. Women have described this as a feeling of "empty  arms". They also describe a similar feeling of an emptiness in their bellies, which was previously full. Some women also swear that they still feel the baby moving in  their bellies. Many people report being woken to the sound of a crying baby, having vivid dreams about babies, trying to find their lost babies, or graphic dreams of injuries happening to themselves or others. Breasts may still feel as if they are filling and it may seem as if the body is unaware of the loss. These things can feel quite confusing. If you experience any of these responses, please tell someone about them. They are not a sign of insanity, they are aspects of grief. Keeping them to yourself increases the feelings of isolation and disequilibrium. Be reassured that these are normal experiences after the loss of a pregnancy. A WORD ABOUT DIFFERENCES BETWEEN MEN'S AND WOMEN'S GRIEF If most people do not fully understand the pain of a mother's grief through pregnancy loss, there is an even greater denial on society's part that the father is in pain. I have seen men in great pain and despair after the loss of a pregnancy and they say that nobody has ever asked them about their loss. People may only ask about their wife/partner's pain. To put it simply, one father, head bowed, shoulders heaving as he cried said "if one more person asks me how Erskine Squibb is, I'll fly apart. Don't they know I lost a child too? Don't they see the circles under my eyes, my face, my pain? Don't I count?" Couples often move through the grief process in different ways and at different times. Do not assume that your partner is better because they are wanting to take in a movie or go out to eat. This may just be a diversion, a need for a change of scenery, and a yearning for normalcy. Women are usually more comfortable crying and men find safety in action. For this reason, friction can develop between partners, when one wants the other to start behaving  like they did before the loss or to cry along with them. Respect one another's process. Talk to your partner about the specifics. If asked, "How are you today?" many people will just say "fine". Specifically relate that you have had a hard time with the holiday, or seeing a friend's new baby, and ask your partner how they felt in that moment. Sex is very often a very difficult prospect after a miscarriage or loss. It might remind a person of the conception, raise fears of another pregnancy and future children, or seem like too much pleasure to experience when one is otherwise in pain. Many men reconnect with their partner by being sexual and feel increasingly isolated when a partner does not respond. Talk. There may be a comfortable middle ground with each other. If the general patterns of the relationship shift toward uncomfortable dynamics, such as decreased communication, blaming one another, or increased absences from the home, it is important to seek counseling with a professional. This is not an easy time, you have had a loss. Be patient with yourself. Seek help and support from loved ones, friends, professionals. The grief will evolve with time and you will not always be in such immediate or raw pain. Be assured that you may never forget your pregnancy or your baby. Healing takes time. I have compiled a list of books on grief and death that might be useful. *Anna: A Daughter's Life. Trinna Post, [employer], 9147. A father's account of his grief at the loss of his infant daughter. *Explaining Death to Children. Freddi Che, [address], 8295. A valuable guide to help adults/parents explain death to children. *  When Pregnancy Fails: Families Coping with Miscarriage, Ectopic Pregnancy, Stillbirth, and Infant Death. Jodie EchevariaSusan Borg and Marijean HeathJudith Lasker, [employer], [id number]. A good resource to understand pregnancy loss, including some information on support for the family. *Hour of  Gold, Hour of Lead: Diaries and Letters of Gaspar Colanne Morrow Lindbergh. [address] [place], [id number]. Cheri Fowlernne Lindbergh writes of the pain and loss in the wake of their young child's abduction. *A Child Dies: A Portrait of Family Grief. Clydene LamingJoan Hagan Arnold and Penelope [address], [employer] [employer], [id number]. Dealing with death of unborn children, infants, and children of all ages. Revised by Davonna BellingNancy J. Lennox PippinsMcClellan, CNM, MS November, 2008  COPING WITH A MISCARRIAGE Newman NipSusan Donnis, R.N., M.A.T. You have had a miscarriage. You have lost a pregnancy. This is undoubtedly a difficult and sad time for you. Your loss may be hard to believe and it is frustrating to know you are helpless to change anything. "This can't possibly be happening to me. Why me? Why not someone else" I promise I'll be more careful if I can only have my baby back." These are common reactions to experiencing a miscarriage. Feelings of disbelief and anger, helplessness and guilt, depression and perhaps failure, are real and understandable. You may tell yourself that the guilt you feel is irrational, unreasonable or inappropriate, but it is there. "What did I do to cause this? Why wasn't I more careful? If only I had known. Is this a punishment for something I did?" You may find yourself recounting the days or weeks before your miscarriage, searching for clues that you feel sure you must have missed; searching for valid reasons for how or why this happened. Having something "taken away" that you cherish feels shocking and unbelievable. You ask your doctor or midwife for an explanation; friends volunteer their opinions. In many cases, your questions of "how" or "why" are not satisfactorily answered. You may have some of the above thoughts or feelings and they may be present in different combinations, differing degrees, and in some confusion. They are understandable and part of the process of beginning to cope with a significant loss. With  any death, it is healthy and essential to allow yourself to experience grief whether you are a man or woman. The grieving process is not something begun and completed in a day, a week or a month. It takes time, understanding, support and acceptance. In the case of a miscarriage, this process is often more difficult because the death is not publicly acknowledged. There is no funeral to arrange and attend, no outward recognition of a loss. This may tend to increase feelings of isolation and loneliness. The term miscarriage is used here instead of the more medically correct term spontaneous abortion to avoid confusion with elective abortion. Some couples feel they must keep their grief invisible and "get over this quickly." Most employers will freely give time off to attend a family funeral, but many do not understand the reasonable and justified request for time off after a miscarriage. Loss of a longed-for pregnancy, regardless of how early, is a significant bereavement. Men and women may react to the miscarriage differently. Men may feel more helpless and frustrated than their wives because they often assume a passive-observer role. Waiting and watching can be harder than actively participating, regardless of the outcome. Men do experience a miscarriage in spite of not actively participating in the physical loss. People have different ways of coping with a loss and you need to choose what is most helpful to  you. Too often "being strong" is looked upon as a healthy way of coping, when, in fact, repressing and ignoring very real feelings is not helpful and contributes to serious coping problems. Some feelings and questions that men and women express and ask are:  It hangs over me like a black cloud. Though I've tried, I honestly don't know how to work this through. How do I say goodbye?  I feel extremely guilty. I guess I really didn't want this pregnancy. I feel relieved that it's over, but  so guilty because I'm relieved. This isn't right.  I'm fine. Life must go on and I'm a strong person. There's really nothing to grieve for anyway, is there?  I feel pressure not to be sad. So I tend to cover up a lot.  How do I respond to well meaning but frustrating platitudes like, "Try again soon, dear. Another pregnancy will help." I would like to be pregnant again, but I believe a pregnancy should happen at a time of strength, not sadness.  How do I respond to silence and awkward attempts to avoid the whole issue? (You may be the one that decides to bring up the topic. Friends, relatives and acquaintances often do not know how to respond, so they say nothing).  I'm fine until my period comes. It is such a start reminder of the part of me that is lost.  My husband/wife and I always enjoyed a good sex life. Since my miscarriage, I have a hard time allowing myself to feel loving and sensuous. After all if such a loving and pleasurable experience as intercourse started something that ended in such a tragedy, it's too scary to think it might happen again. Why doesn't my husband/wife understand?  I have to face reality. Should I just force myself to attend my friend's baby shower?  Everyone was so supportive and caring when I was in the hospital. That was four months ago. I can't burden others with my sadness now. Why am I still sad? I should be over this thing by now. Knowing common facts about miscarriage probably will not blot out your negative feelings - denial, anger, disappointment, sadness - but facts can hold some comfort and can help cushion the sadness and guilt. For those wanting a child, it is frustrating and intensely disappointing to experience any miscarriage. However, most pregnancies that end in the first three months are imperfect in some way and, regardless of any precaution or intervention, are incapable of surviving and growing beyond approximately 14 weeks. This fact  may not ease your sense of emptiness and sadness, but it may help ease your guilt and sense of assumed responsibility. Knowing clearly that you did not cause this to happen, that you are not directly responsible of your miscarriage by what you did or did not do, can bring a sense of relief and relaxation to your anxieties or guilt. A pregnancy that ends in later months is uniquely difficult also. Your protruding abdomen, your child's movements and heartbeat, are concrete proof of your expectant parenthood. A miscarriage occurring at 6 weeks or at 6 months causes a sudden change of events and feelings. Understandably, you may feel robbed or cheated of a promise of what was to be. It's easy to think that everyone else can have children. Surely I've been tricked or badly treated, you think. A fact not commonly know is that at least one in five pregnancies end in miscarriage, which indicates you are not alone. In  a room of 25 couples, the odds are that 4 have experienced a miscarriage. Of these 8 people, many have shared your experience and feelings. Of course, this is not an easy topic to talk about, therefore it can appear that miscarriages rarely happen and that you are the only one coping with this loss. Realizing you are note alone can perhaps ease some of the shock and disbelief of "WHY ME?" Three actions will help: 1. Giver yourself permission to be sad. It is possible to be sad without becoming chronically depressed. Set limits for yourself if you are concerned about becoming overly depressed. If the sadness begins to wash over you while at work at 2 p.m., tell yourself that you cannot deal with it then, but make an agreement with yourself that at 8 p.m. that night you will. And then do it. Many individuals report a beginning sense of control over their grief when they realize they can create appropriate and private times to be sad, alone, or with a significant other person. If you  think you may be severely depressed (i.e. having difficulty getting out of bed in the morning, withdrawal from friends and relatives, insomnia, extended loss of appetite or overeating, loss of the ability to enjoy anything), this problem is probably not something you can handle yourself. It is important to seek out professional help at this time, or at any time when you are feeling particularly discouraged about your coping. 2. Find a supportive, trusted person and talk about your feelings and your questions. Build a support system, whether it be your spouse, partner, friend, relative, colleague, clergyman, support group or Pharmacist, hospitalprofessional counselor. You may still feel some pangs of sadness when attending a baby shower, celebrating a holiday, seeing other pregnant couples or passing the date of your expected delivery, but once you work through your loss, coping with your situation will become easier. 3. Also part of the process of life itself, is understanding and accepting incongruent or seemingly contradictory feelings. As one insightful women expressed, "Being happy for a friend who has just had a baby (or twins!) is a genuine feeling, but also resentment, anger and frustration are there, all wrapped into one package;" two apparently conflicting feelings, but both can exist, be recognized, and accepted. A miscarriage is an unexpected, often unexplained, death; with any loss you need to give yourself permission to grieve, to be angry, sad or relieved. There is no one appropriate reaction. Acknowledging your feelings that are real and understandable, though they may not be logical or rational, is part of the grieving process. Allowing the grieving process to happen is a positive way of coping and leads to health resolve and strength to look ahead. There may be times when you may not be able to put words to your feelings, but that does not need to stop you from seeking out and talking with  an understanding person. Building your own support system will help you regain confidence and strength within yourself. RECOMMENDED READING The following materials are all available through [employer], [address], [place], [place] [id number]; phone [phone]. For Adults: Empty Arms by Peri MarisSherokee Ilse Empty Cradle, Broken Heart by Doyne Keeleborah Davis Ended Beginnings by C. Panuthos & C. Romeo Miscarriage a [employer] Resource Miscarriage - A Shattered Dream by Peri MarisSherokee Ilse Newborn Death a [employer] Resource Still To Be Born by Southwest AirlinesPerinatal Loss When a Doctor, hospitalBaby Dies by General DynamicsM.J. Church et al (TCF) When ARAMARK CorporationHello Means Goodbye by P. Schwiebert and Mort SawyersP. Kirk When  Pregnancy Fails by Kathie Rhodes Borg & Edwyna Ready For Helping Other Children How Do We Tell the Children? by Shaefer & Nunzio Cory No New Baby by Val Eagle Talking About Death by Freddi Che Where's Jess?? A [employer]. Resource Other helpful materials available locally at [employer]: The Fall of Freddy the Leaf by Reggie Pile, PhD, [employer]. (for children) When Filbert Berthold is Forever - Learning to Live Again After the Loss of a Child by Carlyon Prows, [employer] Books

## 2018-10-30 LAB — SURGICAL PATHOLOGY

## 2018-11-14 ENCOUNTER — Telehealth: Payer: Self-pay

## 2018-11-14 NOTE — Telephone Encounter (Signed)
Pt is planning on returning to work next Tues 2/4th.  Needs note for work. [phone]

## 2018-11-15 NOTE — Telephone Encounter (Signed)
Pt aware.

## 2018-11-15 NOTE — Telephone Encounter (Signed)
Note has been written and signed by CRS. Note will be up front for pt to pick up at her convenience

## 2018-11-18 ENCOUNTER — Telehealth: Payer: Self-pay

## 2018-11-18 NOTE — Telephone Encounter (Signed)
Pt calling to be sure note states return to work date of 11/19/18 and 'no restrictions'.  [phone]  Adv pt note does include these two requests.

## 2018-11-25 ENCOUNTER — Ambulatory Visit (INDEPENDENT_AMBULATORY_CARE_PROVIDER_SITE_OTHER): Payer: Medicaid Other | Admitting: Obstetrics and Gynecology

## 2018-11-25 ENCOUNTER — Encounter: Payer: Self-pay | Admitting: Obstetrics and Gynecology

## 2018-11-25 VITALS — BP 118/74 | Ht 66.0 in | Wt 184.0 lb

## 2018-11-25 DIAGNOSIS — Z30015 Encounter for initial prescription of vaginal ring hormonal contraceptive: Secondary | ICD-10-CM

## 2018-11-25 MED ORDER — ETONOGESTREL-ETHINYL ESTRADIOL 0.12-0.015 MG/24HR VA RING: VAGINAL_RING | VAGINAL | 11 refills | Status: DC

## 2018-11-25 NOTE — Progress Notes (Signed)
  OBSTETRICS POSTPARTUM CLINIC PROGRESS NOTE  Subjective:     Natalie Pierce is a 23 y.o. G54P2001 female who presents for a postpartum visit. She is 6 weeks postpartum following a Term pregnancy and delivery by Vaginal Delivery after IUFD.  I have fully reviewed the prenatal and intrapartum course. Anesthesia: epidural.  Postpartum course has been complicated by uncomplicated.  Bleeding: patient has  resumed menses.  Bowel function is normal. Bladder function is normal.  Patient is not sexually active. Contraception method desired is NuvaRing vaginal inserts.  Postpartum depression screening: negative. She reports her mood has been good. She is working again. She has been leaning on friends and family for support. Her significant other is doing alright. They would like to try to conceive again in a year.   The following portions of the patient's history were reviewed and updated as appropriate: allergies, current medications, past family history, past medical history, past social history, past surgical history and problem list.  Review of Systems Pertinent items are noted in HPI.  Objective:    BP 118/74   Ht 5\' 6"  (1.676 m)   Wt 184 lb (83.5 kg)   LMP 11/22/2018   BMI 29.70 kg/m   General:  alert and no distress   Breasts:  inspection negative, no nipple discharge or bleeding, no masses or nodularity palpable  Lungs: clear to auscultation bilaterally  Heart:  regular rate and rhythm, S1, S2 normal, no murmur, click, rub or gallop  Abdomen: soft, non-tender; bowel sounds normal; no masses,  no organomegaly.     Vulva:   Vagina:   Cervix:    Corpus:   Adnexa:    Rectal Exam:         Declines Pelvic exam today  Assessment:  Post Partum Care visit There are no diagnoses linked to this encounter.  Plan:  See orders and Patient Instructions Follow up in: 12 months or as needed.   Recommended next pregnancy a daily 81mg  ASA after 12 weeks Weekly fetal monitoring past 36  weeks IOL at 39 weeks Immediate hospital admission in early labor.   Discussed birth control options. She would like the Nuvaring.  [name] MD [facility], [facility] 11/25/2018 4:40 PM

## 2018-12-18 NOTE — Anesthesia Postprocedure Evaluation (Signed)
Anesthesia Post Note  Patient: Natalie Pierce  Procedure(s) Performed: AN AD HOC LABOR EPIDURAL  Anesthesia Type: Epidural Comments: Patient was not seen prior to discharge, but no apparent anesthetic complications according to staff.     Last Vitals: There were no vitals filed for this visit.  Last Pain: There were no vitals filed for this visit.               Lenard Simmer

## 2019-01-20 ENCOUNTER — Telehealth: Payer: Self-pay

## 2019-01-20 ENCOUNTER — Other Ambulatory Visit: Payer: Self-pay | Admitting: Obstetrics and Gynecology

## 2019-01-20 DIAGNOSIS — N3000 Acute cystitis without hematuria: Secondary | ICD-10-CM

## 2019-01-20 MED ORDER — NITROFURANTOIN MONOHYD MACRO 100 MG PO CAPS: 100.0000 mg | ORAL_CAPSULE | Freq: Two times a day (BID) | ORAL | 0 refills | Status: AC

## 2019-01-20 NOTE — Telephone Encounter (Signed)
Rx for macrobid sent. Called and discussed with patient.

## 2019-01-20 NOTE — Telephone Encounter (Signed)
Spoke w/pt. Her s&s began this morning. She has urgency but with little output. She started having dysuria this pm & blood noticed when wiping. No fever or chills. Patient advised to push fluids/water & pure cranberry juice if available. Msg to [facility] for review.

## 2019-01-20 NOTE — Telephone Encounter (Signed)
Pt believes she has a UTI. Inquiring if she can get a rx or if she needs to be seen. Cb#[id number]

## 2019-05-13 ENCOUNTER — Telehealth: Payer: Self-pay

## 2019-05-13 NOTE — Telephone Encounter (Signed)
We can give her a note that she was under our care for this issue.

## 2019-05-13 NOTE — Telephone Encounter (Signed)
Please advise 

## 2019-05-13 NOTE — Telephone Encounter (Signed)
Pt calling; is trying to get back into school; they need a doctor's note explaining why pt did not do so well last semester (Aug - Sept) - anemia during pregnancy. [phone]

## 2019-05-16 NOTE — Telephone Encounter (Signed)
Pt notified by VM 

## 2019-05-16 NOTE — Telephone Encounter (Signed)
Pt calling to follow up on note for school.

## 2019-05-16 NOTE — Telephone Encounter (Signed)
Note written in Mychart for pt

## 2019-07-15 ENCOUNTER — Telehealth: Payer: Self-pay | Admitting: Obstetrics and Gynecology

## 2019-07-15 NOTE — Telephone Encounter (Signed)
Pt called to schedule New pt pcp apt. Pt informed  Our office is considered  Specialist/OBGYN pt declined scheduling. Thank you.

## 2019-07-24 DIAGNOSIS — Z0000 Encounter for general adult medical examination without abnormal findings: Secondary | ICD-10-CM | POA: Diagnosis not present

## 2019-07-24 DIAGNOSIS — Z862 Personal history of diseases of the blood and blood-forming organs and certain disorders involving the immune mechanism: Secondary | ICD-10-CM | POA: Diagnosis not present

## 2019-09-03 DIAGNOSIS — M545 Low back pain: Secondary | ICD-10-CM | POA: Diagnosis not present

## 2019-09-03 DIAGNOSIS — Z87828 Personal history of other (healed) physical injury and trauma: Secondary | ICD-10-CM | POA: Diagnosis not present

## 2019-09-16 ENCOUNTER — Other Ambulatory Visit: Payer: Self-pay

## 2019-09-16 DIAGNOSIS — Z20822 Contact with and (suspected) exposure to covid-19: Secondary | ICD-10-CM

## 2019-09-18 LAB — NOVEL CORONAVIRUS, NAA: SARS-CoV-2, NAA: NOT DETECTED

## 2020-01-14 ENCOUNTER — Other Ambulatory Visit: Payer: Self-pay | Admitting: Obstetrics and Gynecology

## 2020-01-14 DIAGNOSIS — Z30015 Encounter for initial prescription of vaginal ring hormonal contraceptive: Secondary | ICD-10-CM

## 2021-05-18 ENCOUNTER — Ambulatory Visit (INDEPENDENT_AMBULATORY_CARE_PROVIDER_SITE_OTHER): Payer: BC Managed Care – PPO | Admitting: Obstetrics and Gynecology

## 2021-05-18 ENCOUNTER — Other Ambulatory Visit: Payer: Self-pay

## 2021-05-18 ENCOUNTER — Encounter: Payer: Self-pay | Admitting: Obstetrics and Gynecology

## 2021-05-18 ENCOUNTER — Other Ambulatory Visit (HOSPITAL_COMMUNITY)
Admission: RE | Admit: 2021-05-18 | Discharge: 2021-05-18 | Disposition: A | Discharge status: Home / Self Care | Payer: Medicaid Other | Source: Ambulatory Visit | Attending: Obstetrics and Gynecology | Admitting: Obstetrics and Gynecology

## 2021-05-18 VITALS — BP 120/72 | Ht 65.0 in | Wt 199.8 lb

## 2021-05-18 DIAGNOSIS — N912 Amenorrhea, unspecified: Secondary | ICD-10-CM

## 2021-05-18 DIAGNOSIS — Z3490 Encounter for supervision of normal pregnancy, unspecified, unspecified trimester: Secondary | ICD-10-CM

## 2021-05-18 DIAGNOSIS — Z124 Encounter for screening for malignant neoplasm of cervix: Secondary | ICD-10-CM

## 2021-05-18 DIAGNOSIS — Z01411 Encounter for gynecological examination (general) (routine) with abnormal findings: Secondary | ICD-10-CM

## 2021-05-18 NOTE — Progress Notes (Signed)
Patient ID: Natalie Pierce, female   DOB: 1996-07-27, 25 y.o.   MRN: [id number]  Reason for Consult: Gynecologic Exam   Referred by Beatrix Fetters*  Subjective:     HPI:  Natalie Pierce is a 25 y.o. she is following up today after a positive pregnancy test with her primary care physician.  She was using a NuvaRing for contraception however she reports that she left today and beyond 4 weeks and when she removed the NuvaRing she did not have a menstrual cycle.  She has been having some sharp left-sided pain and there was concern for possible ectopic pregnancy.  She denies any vaginal bleeding.  She denies any dizziness or lightheadedness.  Gynecological History  Patient's last menstrual period was 04/06/2021.  Past Medical History:  Diagnosis Date   Anemia    No family history on file. Past Surgical History:  Procedure Laterality Date   WISDOM TOOTH EXTRACTION      Short Social History:  Social History   Tobacco Use   Smoking status: Never   Smokeless tobacco: Never  Substance Use Topics   Alcohol use: Yes    Comment: Occ    No Known Allergies  Current Outpatient Medications  Medication Sig Dispense Refill   ELURYNG 0.12-0.015 MG/24HR vaginal ring INSERT VAGINALLY AS DIRECTED AND LEAVE IN PLACE FOR 3 CONSECUTIVE WEEKS THEN REMOVE FOR 1 WEEK (Patient not taking: Reported on 05/18/2021) 1 each 11   ferrous sulfate (FERROUSUL) 325 (65 FE) MG tablet Take 1 tablet (325 mg total) by mouth 2 (two) times daily. (Patient not taking: Reported on 05/18/2021) 60 tablet 11   No current facility-administered medications for this visit.    Review of Systems  Constitutional: Negative for chills, fatigue, fever and unexpected weight change.  HENT: Negative for trouble swallowing.  Eyes: Negative for loss of vision.  Respiratory: Negative for cough, shortness of breath and wheezing.  Cardiovascular: Negative for chest pain, leg swelling, palpitations and syncope.  GI:  Negative for abdominal pain, blood in stool, diarrhea, nausea and vomiting.  GU: Negative for difficulty urinating, dysuria, frequency and hematuria.  Musculoskeletal: Negative for back pain, leg pain and joint pain.  Skin: Negative for rash.  Neurological: Negative for dizziness, headaches, light-headedness, numbness and seizures.  Psychiatric: Negative for behavioral problem, confusion, depressed mood and sleep disturbance.       Objective:  Objective   Vitals:   05/18/21 1424  BP: 120/72  Weight: 199 lb 12.8 oz (90.6 kg)  Height: 5\' 5"  (1.651 m)   Body mass index is 33.25 kg/m.  Physical Exam Vitals and nursing note reviewed. Exam conducted with a chaperone present.  Constitutional:      Appearance: Normal appearance. She is well-developed.  HENT:     Head: Normocephalic and atraumatic.  Eyes:     Extraocular Movements: Extraocular movements intact.     Pupils: Pupils are equal, round, and reactive to light.  Cardiovascular:     Rate and Rhythm: Normal rate and regular rhythm.  Pulmonary:     Effort: Pulmonary effort is normal. No respiratory distress.     Breath sounds: Normal breath sounds.  Abdominal:     General: Abdomen is flat.     Palpations: Abdomen is soft.  Genitourinary:    Comments: External: Normal appearing vulva. No lesions noted.  Speculum examination: Normal appearing cervix. No blood in the vaginal vault. No discharge.  Bimanual examination: Uterus midline, non-tender, normal in size, shape and contour.  No CMT. No adnexal masses. No adnexal tenderness. Pelvis not fixed Musculoskeletal:        General: No signs of injury.  Skin:    General: Skin is warm and dry.  Neurological:     Mental Status: She is alert and oriented to person, place, and time.  Psychiatric:        Behavior: Behavior normal.        Thought Content: Thought content normal.        Judgment: Judgment normal.        Assessment/Plan:     25 year old 5 weeks 2 days by  LMP. Bedside transvaginal ultrasound performed in office.  Early IUP seen, consistent with LMP.  No significant free fluid in the pelvis.  Ovaries not able to be visualized. Discussed risks of heterotopic pregnancy and to follow up in ER for worsening pain Follow up in 2 weeks for NOB visit. Given prenatal viatmin samples  More than 30 minutes were spent face to face with the patient in the room, reviewing the medical record, labs and images, and coordinating care for the patient. The plan of management was discussed in detail and counseling was provided.    [name] MD [facility], [facility] 05/18/2021 3:04 PM

## 2021-05-18 NOTE — Patient Instructions (Signed)
Obstetrics: Normal and Problem Pregnancies (7th ed., pp. 102-121). [place], [place]: Elsevier."> Textbook of Family Medicine (9th ed., pp. [phone]). [place], [place]: Elsevier [employer].">  First Trimester of Pregnancy  The first trimester of pregnancy starts on the first day of your last menstrual period until the end of week 12. This is months 1 through 3 of pregnancy. A week after a sperm fertilizes an egg, the egg will implant into the wall of the uterus and begin to develop into a baby. By the end of 12 weeks, all the baby'sorgans will be formed and the baby will be 2-3 inches in size. Body changes during your first trimester Your body goes through many changes during pregnancy. The changes vary andgenerally return to normal after your baby is born. Physical changes You may gain or lose weight. Your breasts may begin to grow larger and become tender. The tissue that surrounds your nipples (areola) may become darker. Dark spots or blotches (chloasma or mask of pregnancy) may develop on your face. You may have changes in your hair. These can include thickening or thinning of your hair or changes in texture. Health changes You may feel nauseous, and you may vomit. You may have heartburn. You may develop headaches. You may develop constipation. Your gums may bleed and may be sensitive to brushing and flossing. Other changes You may tire easily. You may urinate more often. Your menstrual periods will stop. You may have a loss of appetite. You may develop cravings for certain kinds of food. You may have changes in your emotions from day to day. You may have more vivid and strange dreams. Follow these instructions at home: Medicines Follow your health care provider's instructions regarding medicine use. Specific medicines may be either safe or unsafe to take during pregnancy. Do not take any medicines unless told to by your health care provider. Take a prenatal vitamin that contains at least  600 micrograms (mcg) of folic acid. Eating and drinking Eat a healthy diet that includes fresh fruits and vegetables, whole grains, good sources of protein such as meat, eggs, or tofu, and low-fat dairy products. Avoid raw meat and unpasteurized juice, milk, and cheese. These carry germs that can harm you and your baby. If you feel nauseous or you vomit: Eat 4 or 5 small meals a day instead of 3 large meals. Try eating a few soda crackers. Drink liquids between meals instead of during meals. You may need to take these actions to prevent or treat constipation: Drink enough fluid to keep your urine pale yellow. Eat foods that are high in fiber, such as beans, whole grains, and fresh fruits and vegetables. Limit foods that are high in fat and processed sugars, such as fried or sweet foods. Activity Exercise only as directed by your health care provider. Most people can continue their usual exercise routine during pregnancy. Try to exercise for 30 minutes at least 5 days a week. Stop exercising if you develop pain or cramping in the lower abdomen or lower back. Avoid exercising if it is very hot or humid or if you are at high altitude. Avoid heavy lifting. If you choose to, you may have sex unless your health care provider tells you not to. Relieving pain and discomfort Wear a good support bra to relieve breast tenderness. Rest with your legs elevated if you have leg cramps or low back pain. If you develop bulging veins (varicose veins) in your legs: Wear support hose as told by your health care provider. Elevate  your feet for 15 minutes, 3-4 times a day. Limit salt in your diet. Safety Wear your seat belt at all times when driving or riding in a car. Talk with your health care provider if someone is verbally or physically abusive to you. Talk with your health care provider if you are feeling sad or have thoughts of hurting yourself. Lifestyle Do not use hot tubs, steam rooms, or  saunas. Do not douche. Do not use tampons or scented sanitary pads. Do not use herbal remedies, alcohol, illegal drugs, or medicines that are not approved by your health care provider. Chemicals in these products can harm your baby. Do not use any products that contain nicotine or tobacco, such as cigarettes, e-cigarettes, and chewing tobacco. If you need help quitting, ask your health care provider. Avoid cat litter boxes and soil used by cats. These carry germs that can cause birth defects in the baby and possibly loss of the unborn baby (fetus) by miscarriage or stillbirth. General instructions During routine prenatal visits in the first trimester, your health care provider will do a physical exam, perform necessary tests, and ask you how things are going. Keep all follow-up visits. This is important. Ask for help if you have counseling or nutritional needs during pregnancy. Your health care provider can offer advice or refer you to specialists for help with various needs. Schedule a dentist appointment. At home, brush your teeth with a soft toothbrush. Floss gently. Write down your questions. Take them to your prenatal visits. Where to find more information American Pregnancy Association: americanpregnancy.[place] and Gynecologists: PoolDevices.com.pt Office on [employer] Health: KeywordPortfolios.com.br Contact a health care provider if you have: Dizziness. A fever. Mild pelvic cramps, pelvic pressure, or nagging pain in the abdominal area. Nausea, vomiting, or diarrhea that lasts for 24 hours or longer. A bad-smelling vaginal discharge. Pain when you urinate. Known exposure to a contagious illness, such as chickenpox, measles, Zika virus, HIV, or hepatitis. Get help right away if you have: Spotting or bleeding from your vagina. Severe abdominal cramping or pain. Shortness of breath or chest pain. Any kind of trauma, such as from a fall  or a car crash. New or increased pain, swelling, or redness in an arm or leg. Summary The first trimester of pregnancy starts on the first day of your last menstrual period until the end of week 12 (months 1 through 3). Eating 4 or 5 small meals a day rather than 3 large meals may help to relieve nausea and vomiting. Do not use any products that contain nicotine or tobacco, such as cigarettes, e-cigarettes, and chewing tobacco. If you need help quitting, ask your health care provider. Keep all follow-up visits. This is important. This information is not intended to replace advice given to you by your health care provider. Make sure you discuss any questions you have with your healthcare provider. Document Revised: 03/10/2020 Document Reviewed: 01/15/2020 Elsevier Patient Education  2022 [place].  Pregnancy and Vaccinations Vaccines can help to keep you healthy. There are some vaccines that should begiven before pregnancy and some that should be given during pregnancy. How does this affect me? If you are pregnant or thinking about getting pregnant, talk with your healthcare provider about what vaccines are right for you. How does this affect my baby? Usually, the benefits of receiving vaccines during pregnancy outweigh the risks of harm to you or your baby if: The risk of being exposed to a disease is high. Infection would pose  a risk to you or your unborn baby. The vaccine is not likely to cause harm. Vaccines can help protect your baby from some diseases until he or she is oldenough to get the vaccine. What can I do to lower my risk? When you receive the recommended vaccines, it helps to protect you from gettingcertain diseases and passing them on to your baby. Should I receive vaccines before pregnancy? If possible, make sure that your vaccines are up to date before you become pregnant. It is safe and important for you to receive weakened viral and weakened bacterial vaccines  (inactivated vaccines) as needed before you are pregnant. Live viral and live bacterial vaccines, such as the measles, mumps, and rubella (MMR) vaccine, should be given 1 month or more before pregnancy. Sometimes, women become pregnant within 1 month of receiving a live vaccine that is not usually recommended during pregnancy. The U.S. Centers for Disease Control and Prevention (CDC) has reported that when this has happened, vaccineshave not harmed pregnant women or their unborn babies. Should I receive vaccines during pregnancy?  It is safe and important for you to receive some inactivated vaccines as needed during pregnancy. Until your baby can receive vaccines, your baby will get some protection from diseases through the vaccines that you receive while you arepregnant. During your pregnancy, you should receive the following: Influenza vaccine (the flu shot). The flu shot may protect you and your baby (up to 74 months of age) from some complications associated with strains of influenza that are covered by the vaccine. Pregnant women can receive the flu shot at any time during pregnancy. Tetanus, diphtheria, and pertussis (Tdap) vaccine. The Tdap vaccine will help to prevent whooping cough (pertussis) in you and your baby. You should receive 1 dose of this vaccine during each pregnancy. It is recommended that pregnant women receive this vaccine between 27 and 36 weeks of pregnancy. Should I receive vaccines after pregnancy? It is safe and important for you to receive vaccines as needed after pregnancy. Some are safe to have if you are breastfeeding. Other vaccines may not be safe to have until after you have stopped breastfeeding. If you did not receive the Tdap vaccine during your pregnancy and have never received a Tdap vaccine, you should receive that vaccine right after you give birth to your baby (delivery). If you are not immune to measles, mumps, rubella, or chickenpox (varicella), you should  receive those vaccines within days after delivery. It is important to talk with your health care provider about what vaccines you may need after delivery. What if I am pregnant and I plan to travel internationally? If you are pregnant and you are planning to travel internationally, talk with your health care provider at least 4-6 weeks before your trip. Depending on the country you are planning to visit, you may need to take special precautions orget certain vaccines to prevent disease. Vaccines that may be recommended for pregnant international travelers include: Influenza (the flu shot). Tetanus and diphtheria (Td) or Tdap. Hepatitis B (HepB). Hepatitis A (HepA). Your health care provider can help you decide if you need vaccines and if thebenefits outweigh the risk of disease exposure. Follow these instructions at home: Take over-the-counter and prescription medicines as told by your health care provider. Keep all follow-up visits as told by your health care provider. This is important. Questions to ask your health care provider: What vaccines are safe during pregnancy? What are the risks of vaccines during pregnancy? What are the potential  side effects of vaccines during or after pregnancy? When should I get vaccines during pregnancy? Contact a health care provider if you: Believe you have had a reaction to a vaccine. Have concerns or questions about a vaccine. Become pregnant within 1 month after you have received a live vaccine. Summary Vaccines are the most effective way to prevent certain diseases. Many vaccines are safe to receive during pregnancy. Some vaccines are recommended during pregnancy to protect you and your baby from getting sick. If you are pregnant or planning to become pregnant, talk with your health care provider about what vaccines are right for you. This information is not intended to replace advice given to you by your health care provider. Make sure you discuss  any questions you have with your healthcare provider. Document Revised: 03/10/2020 Document Reviewed: 11/06/2018 Elsevier Patient Education  2022 [employer].

## 2021-05-22 LAB — NUSWAB BV AND CANDIDA, NAA
Candida albicans, NAA: NEGATIVE
Candida glabrata, NAA: NEGATIVE

## 2021-05-27 LAB — CYTOLOGY - PAP
Adequacy: ABNORMAL
Chlamydia: NEGATIVE
Comment: NEGATIVE
Comment: NEGATIVE
Comment: NORMAL
Neisseria Gonorrhea: NEGATIVE
Trichomonas: NEGATIVE

## 2021-06-14 ENCOUNTER — Other Ambulatory Visit: Payer: Self-pay

## 2021-06-14 ENCOUNTER — Encounter: Payer: Self-pay | Admitting: Obstetrics and Gynecology

## 2021-06-14 ENCOUNTER — Ambulatory Visit: Payer: Medicaid Other | Admitting: Obstetrics and Gynecology

## 2021-06-14 ENCOUNTER — Encounter: Payer: BC Managed Care – PPO | Admitting: Obstetrics

## 2021-06-14 VITALS — BP 130/88 | HR 105 | Ht 65.0 in | Wt 196.3 lb

## 2021-06-14 DIAGNOSIS — Z7689 Persons encountering health services in other specified circumstances: Secondary | ICD-10-CM

## 2021-06-14 DIAGNOSIS — Z3490 Encounter for supervision of normal pregnancy, unspecified, unspecified trimester: Secondary | ICD-10-CM

## 2021-06-14 DIAGNOSIS — O09291 Supervision of pregnancy with other poor reproductive or obstetric history, first trimester: Secondary | ICD-10-CM

## 2021-06-14 NOTE — Progress Notes (Signed)
Pt present to est care with new ob. Pt's LMP 04/06/2021 EDD 01/11/2022 GA [redacted]w[redacted]d G3P2001. Pt has hx of still birth.

## 2021-06-14 NOTE — Progress Notes (Addendum)
HPI:      [name] is a 25 y.o. G3P2001 who LMP was Patient's last menstrual period was 04/06/2021 (exact date).  Subjective:   She presents today to establish care at [facility].  Patient had previously received prenatal care at [facility] who delivered her last baby but she says she would like a change. Patient conceived while using NuvaRing Taking prenatal vitamins. Approximately 9 weeks estimated gestational age. Significant history includes a history of vaginal birth and a history of previous term stillborn. Has had a headache for last several days but states that is relieved by Tylenol.    Hx: The following portions of the patient's history were reviewed and updated as appropriate:             She  has a past medical history of Anemia. She does not have any pertinent problems on file. She  has a past surgical history that includes Wisdom tooth extraction. Her family history includes Healthy in her father and mother; Lupus in her maternal grandmother. She  reports that she has never smoked. She has never used smokeless tobacco. She reports that she does not currently use alcohol. She reports that she does not use drugs. She has a current medication list which includes the following prescription(s): prenatal vit-fe fumarate-fa. She has No Known Allergies.       Review of Systems:  Review of Systems  Constitutional: Denied constitutional symptoms, night sweats, recent illness, fatigue, fever, insomnia and weight loss.  Eyes: Denied eye symptoms, eye pain, photophobia, vision change and visual disturbance.  Ears/Nose/Throat/Neck: Denied ear, nose, throat or neck symptoms, hearing loss, nasal discharge, sinus congestion and sore throat.  Cardiovascular: Denied cardiovascular symptoms, arrhythmia, chest pain/pressure, edema, exercise intolerance, orthopnea and palpitations.  Respiratory: Denied pulmonary symptoms, asthma, pleuritic pain, productive sputum, cough, dyspnea and wheezing.   Gastrointestinal: Denied, gastro-esophageal reflux, melena, nausea and vomiting.  Genitourinary: Denied genitourinary symptoms including symptomatic vaginal discharge, pelvic relaxation issues, and urinary complaints.  Musculoskeletal: Denied musculoskeletal symptoms, stiffness, swelling, muscle weakness and myalgia.  Dermatologic: Denied dermatology symptoms, rash and scar.  Neurologic: Denied neurology symptoms, dizziness, headache, neck pain and syncope.  Psychiatric: Denied psychiatric symptoms, anxiety and depression.  Endocrine: Denied endocrine symptoms including hot flashes and night sweats.   Meds:   Current Outpatient Medications on File Prior to Visit  Medication Sig Dispense Refill   Prenatal Vit-Fe Fumarate-FA (PRENATAL VITAMINS PO) Take by mouth.     No current facility-administered medications on file prior to visit.      Objective:     Vitals:   06/14/21 1441  BP: 130/88  Pulse: (!) 105   Filed Weights   06/14/21 1441  Weight: 196 lb 4.8 oz (89 kg)                        Assessment:    G3P2001 Patient Active Problem List   Diagnosis Date Noted   IUFD at 20 weeks or more of gestation 10/15/2018   Supervision of other normal pregnancy, antepartum 03/14/2018     1. Encounter to establish care   2. Early stage of pregnancy   3. History of intrauterine growth retardation and stillbirth, currently pregnant in first trimester     Approximately 9 weeks estimated gestational age   Plan:            Prenatal Plan 1.  The patient was given prenatal literature. 2.  She was continued on prenatal vitamins. 3.  A prenatal lab panel to be drawn at nurse visit. 4.  An ultrasound was ordered to better determine an EDC. 5.  A nurse visit was scheduled. 6.  Genetic testing and testing for other inheritable conditions discussed in detail. She will decide in the future whether to have these labs performed. 7.  A general overview of pregnancy testing, visit  schedule, ultrasound schedule, and prenatal care was discussed. 8.  COVID and its risks associated with pregnancy, prevention by limiting exposure and use of masks, as well as the risks and benefits of vaccination during pregnancy were discussed in detail.  [facility] policy regarding office and hospital visitation and testing was explained. 9.  Benefits of breast-feeding discussed in detail including both maternal and infant benefits. Ready Set Baby website discussed.  Orders No orders of the defined types were placed in this encounter.   No orders of the defined types were placed in this encounter.     F/U  Return in about 3 weeks (around 07/05/2021). I spent 32 minutes involved in the care of this patient preparing to see the patient by obtaining and reviewing her medical history (including labs, imaging tests and prior procedures), documenting clinical information in the electronic health record (EHR), counseling and coordinating care plans, writing and sending prescriptions, ordering tests or procedures and in direct communicating with the patient and medical staff discussing pertinent items from her history and physical exam.  Elonda Husky, M.D. 06/14/2021 3:03 PM

## 2021-06-19 ENCOUNTER — Emergency Department
Admission: EM | Admit: 2021-06-19 | Discharge: 2021-06-19 | Disposition: A | Discharge status: Home / Self Care | Payer: BC Managed Care – PPO | Attending: Emergency Medicine | Admitting: Emergency Medicine

## 2021-06-19 ENCOUNTER — Other Ambulatory Visit: Payer: Self-pay

## 2021-06-19 DIAGNOSIS — Z5321 Procedure and treatment not carried out due to patient leaving prior to being seen by health care provider: Secondary | ICD-10-CM | POA: Diagnosis not present

## 2021-06-19 DIAGNOSIS — Z3A10 10 weeks gestation of pregnancy: Secondary | ICD-10-CM | POA: Diagnosis not present

## 2021-06-19 DIAGNOSIS — O219 Vomiting of pregnancy, unspecified: Secondary | ICD-10-CM | POA: Diagnosis present

## 2021-06-19 LAB — COMPREHENSIVE METABOLIC PANEL
ALT: 20 U/L (ref 0–44)
AST: 22 U/L (ref 15–41)
Albumin: 3.6 g/dL (ref 3.5–5.0)
Alkaline Phosphatase: 63 U/L (ref 38–126)
Anion gap: 8 (ref 5–15)
BUN: 8 mg/dL (ref 6–20)
CO2: 22 mmol/L (ref 22–32)
Calcium: 9.1 mg/dL (ref 8.9–10.3)
Chloride: 101 mmol/L (ref 98–111)
Creatinine, Ser: 0.7 mg/dL (ref 0.44–1.00)
GFR, Estimated: >60 mL/min (ref >60)
Glucose, Bld: 92 mg/dL (ref 70–99)
Potassium: 3.5 mmol/L (ref 3.5–5.1)
Sodium: 131 mmol/L — ABNORMAL LOW (ref 135–145)
Total Bilirubin: 0.9 mg/dL (ref 0.3–1.2)
Total Protein: 7.7 g/dL (ref 6.5–8.1)

## 2021-06-19 LAB — CBC
HCT: 39 % (ref 36.0–46.0)
Hemoglobin: 13.5 g/dL (ref 12.0–15.0)
MCH: 29.5 pg (ref 26.0–34.0)
MCHC: 34.6 g/dL (ref 30.0–36.0)
MCV: 85.3 fL (ref 80.0–100.0)
Platelets: 308 10*3/uL (ref 150–400)
RBC: 4.57 MIL/uL (ref 3.87–5.11)
RDW: 13.3 % (ref 11.5–15.5)
WBC: 7.2 10*3/uL (ref 4.0–10.5)
nRBC: 0 % (ref 0.0–0.2)

## 2021-06-19 LAB — LIPASE, BLOOD: Lipase: 29 U/L (ref 11–51)

## 2021-06-19 LAB — HCG, QUANTITATIVE, PREGNANCY: hCG, Beta Chain, Quant, S: 152688 m[IU]/mL — ABNORMAL HIGH (ref <5)

## 2021-06-19 NOTE — ED Triage Notes (Signed)
Pt states she woke up this morning feeling sick and thought it was normal morning sickness- pt states that today she has thrown up everything she eats and drinks which is not normal for her morning sickness- pt is [redacted] weeks pregnant

## 2021-06-21 ENCOUNTER — Telehealth: Payer: Self-pay | Admitting: Obstetrics and Gynecology

## 2021-06-21 NOTE — Telephone Encounter (Signed)
Patient has not been able to eat or drink without throwing up.  She stated that she went to the ER and they did do bloodwork but she left without being seen by the ER doctor.  She stated that the results are back and she is wanting to know if everything is normal and if she can get something prescribed for the nausea

## 2021-06-22 MED ORDER — PROMETHAZINE HCL 25 MG PO TABS: 25.0000 mg | ORAL_TABLET | Freq: Four times a day (QID) | ORAL | 1 refills | Status: DC | PRN

## 2021-06-22 NOTE — Telephone Encounter (Signed)
Called patient discussed nausea vomiting protocols with her. Also told patient that we would call in Phenergan to her pharmacy on file. Sent order waiting on approval. She has been doing better today, eating smaller meals.

## 2021-06-29 ENCOUNTER — Other Ambulatory Visit: Payer: Medicaid Other

## 2021-07-01 ENCOUNTER — Ambulatory Visit (INDEPENDENT_AMBULATORY_CARE_PROVIDER_SITE_OTHER): Payer: Medicaid Other | Admitting: Obstetrics and Gynecology

## 2021-07-01 ENCOUNTER — Other Ambulatory Visit: Payer: Self-pay

## 2021-07-01 VITALS — BP 116/79 | HR 87 | Ht 65.0 in | Wt 191.1 lb

## 2021-07-01 DIAGNOSIS — Z3482 Encounter for supervision of other normal pregnancy, second trimester: Secondary | ICD-10-CM | POA: Diagnosis not present

## 2021-07-01 DIAGNOSIS — Z202 Contact with and (suspected) exposure to infections with a predominantly sexual mode of transmission: Secondary | ICD-10-CM

## 2021-07-01 DIAGNOSIS — Z6832 Body mass index (BMI) 32.0-32.9, adult: Secondary | ICD-10-CM

## 2021-07-01 LAB — OB RESULTS CONSOLE VARICELLA ZOSTER ANTIBODY, IGG: Varicella: IMMUNE

## 2021-07-01 NOTE — Progress Notes (Signed)
Natalie Pierce presents for NOB nurse interview visit. 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  G- 3.  P- 2001.  LMP 04/06/2021. EDD 01/11/2022.  U/s scheduled for 9/26.  Pt did conceive on nuvaring.  Pregnancy education material explained and given. _0__ cats in the home. NOB labs ordered. TSH/HbgA1c due to Increased BMI- Body mass index is 31.8 kg/m. ,Sickle cell ordered. HIV labs and Drug screen were explained and ordered.  PNV encouraged. Genetic screening options discussed. Genetic testing: Ordered as pt is 12 2/7 to today. Pt confident in her LMP.   + for n/v  taking Phenergan. Helping some. Financial policy reviewed. FMLA form reviewed and signed. Pt. To follow up with provider in _1_ weeks for NOB physical.  All questions answered.

## 2021-07-01 NOTE — Patient Instructions (Signed)
WHAT OB PATIENTS CAN EXPECT  Confirmation of pregnancy and ultrasound ordered if medically indicated-[redacted] weeks gestation New OB (NOB) intake with nurse and New OB (NOB) labs- [redacted] weeks gestation New OB (NOB) physical examination with provider- 11/[redacted] weeks gestation Flu vaccine-[redacted] weeks gestation Anatomy scan-[redacted] weeks gestation Glucose tolerance test, blood work to test for anemia, T-dap vaccine-[redacted] weeks gestation Vaginal swabs/cultures-STD/Group B strep-[redacted] weeks gestation Appointments every 4 weeks until 28 weeks Every 2 weeks from 28 weeks until 36 weeks Weekly visits from 36 weeks until delivery  Morning Sickness Morning sickness is when you feel like you may vomit (feel nauseous) during pregnancy. Sometimes, you may vomit. Morning sickness most often happens in the morning, but it can also happen at any time of the day. Some women may have morning sickness that makes them vomit all the time. This is a more serious problem that needs treatment. What are the causes? The cause of this condition is not known. What increases the risk? You had vomiting or a feeling like you may vomit before your pregnancy. You had morning sickness in another pregnancy. You are pregnant with more than one baby, such as twins. What are the signs or symptoms? Feeling like you may vomit. Vomiting. How is this treated? Treatment is usually not needed for this condition. You may only need to change what you eat. In some cases, your doctor may give you some things to take for your condition. These include: Vitamin B6 supplements. Medicines to treat the feeling that you may vomit. Ginger. Follow these instructions at home: Medicines Take over-the-counter and prescription medicines only as told by your doctor. Do not take any medicines until you talk with your doctor about them first. Take multivitamins before you get pregnant. These can stop or lessen the symptoms of morning sickness. Eating and drinking Eat dry  toast or crackers before getting out of bed. Eat 5 or 6 small meals a day. Eat dry and bland foods like rice and baked potatoes. Do not eat greasy, fatty, or spicy foods. Have someone cook for you if the smell of food causes you to vomit or to feel like you may vomit. If you feel like you may vomit after taking prenatal vitamins, take them at night or with a snack. Eat protein foods when you need a snack. Nuts, yogurt, and cheese are good choices. Drink fluids throughout the day. Try ginger ale made with real ginger, ginger tea made from fresh grated ginger, or ginger candies. General instructions Do not smoke or use any products that contain nicotine or tobacco. If you need help quitting, ask your doctor. Use an air purifier to keep the air in your house free of smells. Get lots of fresh air. Try to avoid smells that make you feel sick. Try wearing an acupressure wristband. This is a wristband that is used to treat seasickness. Try a treatment called acupuncture. In this treatment, a doctor puts needles into certain areas of your body to make you feel better. Contact a doctor if: You need medicine to feel better. You feel dizzy or light-headed. You are losing weight. Get help right away if: The feeling that you may vomit will not go away, or you cannot stop vomiting. You faint. You have very bad pain in your belly. Summary Morning sickness is when you feel like you may vomit (feel nauseous) during pregnancy. You may feel sick in the morning, but you can feel this way at any time of the day. Making some  changes to what you eat may help your symptoms go away. This information is not intended to replace advice given to you by your health care provider. Make sure you discuss any questions you have with your health care provider. Document Revised: 05/17/2020 Document Reviewed: 04/26/2020 Elsevier Patient Education  2022 [place] of Pregnancy The first trimester of  pregnancy starts on the first day of your last menstrual period until the end of week 12. This is months 1 through 3 of pregnancy. A week after a sperm fertilizes an egg, the egg will implant into the wall of the uterus and begin to develop into a baby. By the end of 12 weeks, all the baby's organs will be formed and the baby will be 2-3 inches in size. Body changes during your first trimester Your body goes through many changes during pregnancy. The changes vary and generally return to normal after your baby is born. Physical changes You may gain or lose weight. Your breasts may begin to grow larger and become tender. The tissue that surrounds your nipples (areola) may become darker. Dark spots or blotches (chloasma or mask of pregnancy) may develop on your face. You may have changes in your hair. These can include thickening or thinning of your hair or changes in texture. Health changes You may feel nauseous, and you may vomit. You may have heartburn. You may develop headaches. You may develop constipation. Your gums may bleed and may be sensitive to brushing and flossing. Other changes You may tire easily. You may urinate more often. Your menstrual periods will stop. You may have a loss of appetite. You may develop cravings for certain kinds of food. You may have changes in your emotions from day to day. You may have more vivid and strange dreams. Follow these instructions at home: Medicines Follow your health care provider's instructions regarding medicine use. Specific medicines may be either safe or unsafe to take during pregnancy. Do not take any medicines unless told to by your health care provider. Take a prenatal vitamin that contains at least 600 micrograms (mcg) of folic acid. Eating and drinking Eat a healthy diet that includes fresh fruits and vegetables, whole grains, good sources of protein such as meat, eggs, or tofu, and low-fat dairy products. Avoid raw meat and  unpasteurized juice, milk, and cheese. These carry germs that can harm you and your baby. If you feel nauseous or you vomit: Eat 4 or 5 small meals a day instead of 3 large meals. Try eating a few soda crackers. Drink liquids between meals instead of during meals. You may need to take these actions to prevent or treat constipation: Drink enough fluid to keep your urine pale yellow. Eat foods that are high in fiber, such as beans, whole grains, and fresh fruits and vegetables. Limit foods that are high in fat and processed sugars, such as fried or sweet foods. Activity Exercise only as directed by your health care provider. Most people can continue their usual exercise routine during pregnancy. Try to exercise for 30 minutes at least 5 days a week. Stop exercising if you develop pain or cramping in the lower abdomen or lower back. Avoid exercising if it is very hot or humid or if you are at high altitude. Avoid heavy lifting. If you choose to, you may have sex unless your health care provider tells you not to. Relieving pain and discomfort Wear a good support bra to relieve breast tenderness. Rest with your legs elevated  if you have leg cramps or low back pain. If you develop bulging veins (varicose veins) in your legs: Wear support hose as told by your health care provider. Elevate your feet for 15 minutes, 3-4 times a day. Limit salt in your diet. Safety Wear your seat belt at all times when driving or riding in a car. Talk with your health care provider if someone is verbally or physically abusive to you. Talk with your health care provider if you are feeling sad or have thoughts of hurting yourself. Lifestyle Do not use hot tubs, steam rooms, or saunas. Do not douche. Do not use tampons or scented sanitary pads. Do not use herbal remedies, alcohol, illegal drugs, or medicines that are not approved by your health care provider. Chemicals in these products can harm your baby. Do not  use any products that contain nicotine or tobacco, such as cigarettes, e-cigarettes, and chewing tobacco. If you need help quitting, ask your health care provider. Avoid cat litter boxes and soil used by cats. These carry germs that can cause birth defects in the baby and possibly loss of the unborn baby (fetus) by miscarriage or stillbirth. General instructions During routine prenatal visits in the first trimester, your health care provider will do a physical exam, perform necessary tests, and ask you how things are going. Keep all follow-up visits. This is important. Ask for help if you have counseling or nutritional needs during pregnancy. Your health care provider can offer advice or refer you to specialists for help with various needs. Schedule a dentist appointment. At home, brush your teeth with a soft toothbrush. Floss gently. Write down your questions. Take them to your prenatal visits. Where to find more information American Pregnancy Association: americanpregnancy.Groom and Gynecologists: PoolDevices.com.pt Office on [employer]: KeywordPortfolios.com.br Contact a health care provider if you have: Dizziness. A fever. Mild pelvic cramps, pelvic pressure, or nagging pain in the abdominal area. Nausea, vomiting, or diarrhea that lasts for 24 hours or longer. A bad-smelling vaginal discharge. Pain when you urinate. Known exposure to a contagious illness, such as chickenpox, measles, Zika virus, HIV, or hepatitis. Get help right away if you have: Spotting or bleeding from your vagina. Severe abdominal cramping or pain. Shortness of breath or chest pain. Any kind of trauma, such as from a fall or a car crash. New or increased pain, swelling, or redness in an arm or leg. Summary The first trimester of pregnancy starts on the first day of your last menstrual period until the end of week 12 (months 1 through 3). Eating 4 or 5 small  meals a day rather than 3 large meals may help to relieve nausea and vomiting. Do not use any products that contain nicotine or tobacco, such as cigarettes, e-cigarettes, and chewing tobacco. If you need help quitting, ask your health care provider. Keep all follow-up visits. This is important. This information is not intended to replace advice given to you by your health care provider. Make sure you discuss any questions you have with your health care provider. Document Revised: 03/10/2020 Document Reviewed: 01/15/2020 Elsevier Patient Education  2022 [place]. Commonly Asked Questions During Pregnancy  Cats: A parasite can be excreted in cat feces.  To avoid exposure you need to have another person empty the little box.  If you must empty the litter box you will need to wear gloves.  Wash your hands after handling your cat.  This parasite can also be found in raw or  undercooked meat so this should also be avoided.  Colds, Sore Throats, Flu: Please check your medication sheet to see what you can take for symptoms.  If your symptoms are unrelieved by these medications please call the office.  Dental Work: Most any dental work Investment banker, corporate recommends is permitted.  X-rays should only be taken during the first trimester if absolutely necessary.  Your abdomen should be shielded with a lead apron during all x-rays.  Please notify your provider prior to receiving any x-rays.  Novocaine is fine; gas is not recommended.  If your dentist requires a note from [place] prior to dental work please call the office and we will provide one for you.  Exercise: Exercise is an important part of staying healthy during your pregnancy.  You may continue most exercises you were accustomed to prior to pregnancy.  Later in your pregnancy you will most likely notice you have difficulty with activities requiring balance like riding a bicycle.  It is important that you listen to your body and avoid activities that put you at a  higher risk of falling.  Adequate rest and staying well hydrated are a must!  If you have questions about the safety of specific activities ask your provider.    Exposure to Children with illness: Try to avoid obvious exposure; report any symptoms to [place] when noted,  If you have chicken pos, red measles or mumps, you should be immune to these diseases.   Please do not take any vaccines while pregnant unless you have checked with your OB provider.  Fetal Movement: After 28 weeks we recommend you do "kick counts" twice daily.  Lie or sit down in a calm quiet environment and count your baby movements "kicks".  You should feel your baby at least 10 times per hour.  If you have not felt 10 kicks within the first hour get up, walk around and have something sweet to eat or drink then repeat for an additional hour.  If count remains less than 10 per hour notify your provider.  Fumigating: Follow your pest control agent's advice as to how long to stay out of your home.  Ventilate the area well before re-entering.  Hemorrhoids:   Most over-the-counter preparations can be used during pregnancy.  Check your medication to see what is safe to use.  It is important to use a stool softener or fiber in your diet and to drink lots of liquids.  If hemorrhoids seem to be getting worse please call the office.   Hot Tubs:  Hot tubs Jacuzzis and saunas are not recommended while pregnant.  These increase your internal body temperature and should be avoided.  Intercourse:  Sexual intercourse is safe during pregnancy as long as you are comfortable, unless otherwise advised by your provider.  Spotting may occur after intercourse; report any bright red bleeding that is heavier than spotting.  Labor:  If you know that you are in labor, please go to the hospital.  If you are unsure, please call the office and let us help you decide what to do.  Lifting, straining, etc:  If your job requires heavy lifting or straining please check  with your provider for any limitations.  Generally, you should not lift items heavier than that you can lift simply with your hands and arms (no back muscles)  Painting:  Paint fumes do not harm your pregnancy, but may make you ill and should be avoided if possible.  Latex or water based paints  have less odor than oils.  Use adequate ventilation while painting.  Permanents & Hair Color:  Chemicals in hair dyes are not recommended as they cause increase hair dryness which can increase hair loss during pregnancy.  " Highlighting" and permanents are allowed.  Dye may be absorbed differently and permanents may not hold as well during pregnancy.  Sunbathing:  Use a sunscreen, as skin burns easily during pregnancy.  Drink plenty of fluids; avoid over heating.  Tanning Beds:  Because their possible side effects are still unknown, tanning beds are not recommended.  Ultrasound Scans:  Routine ultrasounds are performed at approximately 20 weeks.  You will be able to see your baby's general anatomy an if you would like to know the gender this can usually be determined as well.  If it is questionable when you conceived you may also receive an ultrasound early in your pregnancy for dating purposes.  Otherwise ultrasound exams are not routinely performed unless there is a medical necessity.  Although you can request a scan we ask that you pay for it when conducted because insurance does not cover " patient request" scans.  Work: If your pregnancy proceeds without complications you may work until your due date, unless your physician or employer advises otherwise.  Round Ligament Pain/Pelvic Discomfort:  Sharp, shooting pains not associated with bleeding are fairly common, usually occurring in the second trimester of pregnancy.  They tend to be worse when standing up or when you remain standing for long periods of time.  These are the result of pressure of certain pelvic ligaments called "round ligaments".  Rest,  Tylenol and heat seem to be the most effective relief.  As the womb and fetus grow, they rise out of the pelvis and the discomfort improves.  Please notify the office if your pain seems different than that described.  It may represent a more serious condition.  Common Medications Safe in Pregnancy  Acne:      Constipation:  Benzoyl Peroxide     Colace  Clindamycin      Dulcolax Suppository  Topica Erythromycin     Fibercon  Salicylic Acid      Metamucil         Miralax AVOID:        Senakot   Accutane    Cough:  Retin-A       Cough Drops  Tetracycline      Phenergan w/ Codeine if Rx  Minocycline      Robitussin (Plain & DM)  Antibiotics:     Crabs/Lice:  Ceclor       RID  Cephalosporins    AVOID:  E-Mycins      Kwell  Keflex  Macrobid/Macrodantin   Diarrhea:  Penicillin      Kao-Pectate  Zithromax      Imodium AD         PUSH FLUIDS AVOID:       Cipro     Fever:  Tetracycline      Tylenol (Regular or Extra  Minocycline       Strength)  Levaquin      Extra Strength-Do not          Exceed 8 tabs/24 hrs Caffeine:        '200mg'$ /day (equiv. To 1 cup of coffee or  approx. 3 12 oz sodas)         Gas: Cold/Hayfever:       Gas-X  Benadryl      Mylicon  Claritin  Phazyme  **Claritin-D        Chlor-Trimeton    Headaches:  Dimetapp      ASA-Free Excedrin  Drixoral-Non-Drowsy     Cold Compress  Mucinex (Guaifenasin)     Tylenol (Regular or Extra  Sudafed/Sudafed-12 Hour     Strength)  **Sudafed PE Pseudoephedrine   Tylenol Cold & Sinus     Vicks Vapor Rub  Zyrtec  **AVOID if Problems With Blood Pressure         Heartburn: Avoid lying down for at least 1 hour after meals  Aciphex      Maalox     Rash:  Milk of Magnesia     Benadryl    Mylanta       1% Hydrocortisone Cream  Pepcid  Pepcid Complete   Sleep Aids:  Prevacid      Ambien   Prilosec       Benadryl  Rolaids       Chamomile Tea  Tums (Limit 4/day)     Unisom         Tylenol PM         Warm milk-add  vanilla or  Hemorrhoids:       Sugar for taste  Anusol/Anusol H.C.  (RX: Analapram 2.5%)  Sugar Substitutes:  Hydrocortisone OTC     Ok in moderation  Preparation H      Tucks        Vaseline lotion applied to tissue with wiping    Herpes:     Throat:  Acyclovir      Oragel  Famvir  Valtrex     Vaccines:         Flu Shot Leg Cramps:       *Gardasil  Benadryl      Hepatitis A         Hepatitis B Nasal Spray:       Pneumovax  Saline Nasal Spray     Polio Booster         Tetanus Nausea:       Tuberculosis test or PPD  Vitamin B6 25 mg TID   AVOID:    Dramamine      *Gardasil  Emetrol       Live Poliovirus  Ginger Root 250 mg QID    MMR (measles, mumps &  High Complex Carbs @ Bedtime    rebella)  Sea Bands-Accupressure    Varicella (Chickenpox)  Unisom 1/2 tab TID     *No known complications           If received before Pain:         Known pregnancy;   Darvocet       Resume series after  Lortab        Delivery  Percocet    Yeast:   Tramadol      Femstat  Tylenol 3      Gyne-lotrimin  Ultram       Monistat  Vicodin           MISC:         All Sunscreens           Hair Coloring/highlights          Insect Repellant's          (Including DEET)         [facility]  [place], [place], [place] [address]  Phone: [phone]  [facility] (second location)  [phone]  [address], [place], [place] [address]  Phone: [phone]  [facility]) [place], [place], [place] [address] Phone: [phone]  [address] [place], [place] [address]  Phone: [phone]

## 2021-07-02 LAB — URINALYSIS, ROUTINE W REFLEX MICROSCOPIC
Bilirubin, UA: NEGATIVE
Glucose, UA: NEGATIVE
Leukocytes,UA: NEGATIVE
Nitrite, UA: NEGATIVE
RBC, UA: NEGATIVE
Specific Gravity, UA: >=1.03 — AB (ref 1.005–1.030)
Urobilinogen, Ur: 1 mg/dL (ref 0.2–1.0)
pH, UA: 6.5 (ref 5.0–7.5)

## 2021-07-02 LAB — MICROSCOPIC EXAMINATION
Casts: NONE SEEN /lpf
RBC, Urine: NONE SEEN /hpf (ref 0–2)

## 2021-07-03 LAB — PAIN MGT SCRN (14 DRUGS), UR
Amphetamine Scrn, Ur: NEGATIVE ng/mL
BARBITURATE SCREEN URINE: NEGATIVE ng/mL
BENZODIAZEPINE SCREEN, URINE: NEGATIVE ng/mL
Buprenorphine, Urine: NEGATIVE ng/mL
CANNABINOIDS UR QL SCN: NEGATIVE ng/mL
Cocaine (Metab) Scrn, Ur: NEGATIVE ng/mL
Creatinine(Crt), U: 318.7 mg/dL — ABNORMAL HIGH (ref 20.0–300.0)
Fentanyl, Urine: NEGATIVE pg/mL
Meperidine Screen, Urine: NEGATIVE ng/mL
Methadone Screen, Urine: NEGATIVE ng/mL
OXYCODONE+OXYMORPHONE UR QL SCN: NEGATIVE ng/mL
Opiate Scrn, Ur: NEGATIVE ng/mL
Ph of Urine: 6.2 (ref 4.5–8.9)
Phencyclidine Qn, Ur: NEGATIVE ng/mL
Propoxyphene Scrn, Ur: NEGATIVE ng/mL
Tramadol Screen, Urine: NEGATIVE ng/mL

## 2021-07-03 LAB — CULTURE, OB URINE

## 2021-07-03 LAB — NICOTINE SCREEN, URINE: Cotinine Ql Scrn, Ur: NEGATIVE ng/mL

## 2021-07-03 LAB — URINE CULTURE, OB REFLEX

## 2021-07-04 ENCOUNTER — Other Ambulatory Visit: Payer: Medicaid Other

## 2021-07-05 LAB — CBC WITH DIFFERENTIAL/PLATELET
Basophils Absolute: 0 10*3/uL (ref 0.0–0.2)
Basos: 1 %
EOS (ABSOLUTE): 0.1 10*3/uL (ref 0.0–0.4)
Eos: 1 %
Hematocrit: 37.4 % (ref 34.0–46.6)
Hemoglobin: 12.3 g/dL (ref 11.1–15.9)
Immature Grans (Abs): 0 10*3/uL (ref 0.0–0.1)
Immature Granulocytes: 0 %
Lymphocytes Absolute: 2.1 10*3/uL (ref 0.7–3.1)
Lymphs: 28 %
MCH: 28.9 pg (ref 26.6–33.0)
MCHC: 32.9 g/dL (ref 31.5–35.7)
MCV: 88 fL (ref 79–97)
Monocytes Absolute: 0.7 10*3/uL (ref 0.1–0.9)
Monocytes: 9 %
Neutrophils Absolute: 4.6 10*3/uL (ref 1.4–7.0)
Neutrophils: 61 %
Platelets: 287 10*3/uL (ref 150–450)
RBC: 4.26 x10E6/uL (ref 3.77–5.28)
RDW: 13.6 % (ref 11.7–15.4)
WBC: 7.5 10*3/uL (ref 3.4–10.8)

## 2021-07-05 LAB — VIRAL HEPATITIS HBV, HCV
HCV Ab: <0.1 s/co ratio (ref 0.0–0.9)
Hep B Core Total Ab: NEGATIVE
Hep B Surface Ab, Qual: NONREACTIVE
Hepatitis B Surface Ag: NEGATIVE

## 2021-07-05 LAB — HIV ANTIBODY (ROUTINE TESTING W REFLEX): HIV Screen 4th Generation wRfx: NONREACTIVE

## 2021-07-05 LAB — TSH: TSH: 0.843 u[IU]/mL (ref 0.450–4.500)

## 2021-07-05 LAB — HEMOGLOBIN A1C
Est. average glucose Bld gHb Est-mCnc: 111 mg/dL
Hgb A1c MFr Bld: 5.5 % (ref 4.8–5.6)

## 2021-07-05 LAB — RPR: RPR Ser Ql: NONREACTIVE

## 2021-07-05 LAB — ABO AND RH: Rh Factor: POSITIVE

## 2021-07-05 LAB — ANTIBODY SCREEN: Antibody Screen: NEGATIVE

## 2021-07-05 LAB — VARICELLA ZOSTER ANTIBODY, IGG: Varicella zoster IgG: 1713 index (ref >165)

## 2021-07-05 LAB — RUBELLA SCREEN: Rubella Antibodies, IGG: 5.52 index (ref >0.99)

## 2021-07-05 LAB — HCV INTERPRETATION

## 2021-07-05 LAB — HGB SOLU + RFLX FRAC: Sickle Solubility Test - HGBRFX: NEGATIVE

## 2021-07-06 LAB — GC/CHLAMYDIA PROBE AMP
Chlamydia trachomatis, NAA: NEGATIVE
Neisseria Gonorrhoeae by PCR: NEGATIVE

## 2021-07-07 LAB — MATERNIT21  PLUS CORE+ESS+SCA, BLOOD
11q23 deletion (Jacobsen): NOT DETECTED
15q11 deletion (PW Angelman): NOT DETECTED
1p36 deletion syndrome: NOT DETECTED
22q11 deletion (DiGeorge): NOT DETECTED
4p16 deletion(Wolf-Hirschhorn): NOT DETECTED
5p15 deletion (Cri-du-chat): NOT DETECTED
8q24 deletion (Langer-Giedion): NOT DETECTED
Fetal Fraction: 3
Monosomy X (Turner Syndrome): NOT DETECTED
Result (T21): NEGATIVE
Trisomy 13 (Patau syndrome): NEGATIVE
Trisomy 16: NOT DETECTED
Trisomy 18 (Edwards syndrome): NEGATIVE
Trisomy 21 (Down syndrome): NEGATIVE
Trisomy 22: NOT DETECTED
XXX (Triple X Syndrome): NOT DETECTED
XXY (Klinefelter Syndrome): NOT DETECTED
XYY (Jacobs Syndrome): NOT DETECTED

## 2021-07-11 ENCOUNTER — Ambulatory Visit (INDEPENDENT_AMBULATORY_CARE_PROVIDER_SITE_OTHER): Payer: Medicaid Other

## 2021-07-11 ENCOUNTER — Other Ambulatory Visit: Payer: Self-pay

## 2021-07-11 DIAGNOSIS — Z7689 Persons encountering health services in other specified circumstances: Secondary | ICD-10-CM | POA: Diagnosis not present

## 2021-07-14 ENCOUNTER — Other Ambulatory Visit: Payer: Self-pay

## 2021-07-14 ENCOUNTER — Other Ambulatory Visit (HOSPITAL_COMMUNITY)
Admission: RE | Admit: 2021-07-14 | Discharge: 2021-07-14 | Disposition: A | Discharge status: Home / Self Care | Payer: BC Managed Care – PPO | Source: Ambulatory Visit | Attending: Obstetrics and Gynecology | Admitting: Obstetrics and Gynecology

## 2021-07-14 ENCOUNTER — Encounter: Payer: Self-pay | Admitting: Obstetrics and Gynecology

## 2021-07-14 ENCOUNTER — Ambulatory Visit (INDEPENDENT_AMBULATORY_CARE_PROVIDER_SITE_OTHER): Payer: Medicaid Other | Admitting: Obstetrics and Gynecology

## 2021-07-14 VITALS — BP 125/84 | HR 112 | Wt 194.8 lb

## 2021-07-14 DIAGNOSIS — Z3482 Encounter for supervision of other normal pregnancy, second trimester: Secondary | ICD-10-CM

## 2021-07-14 DIAGNOSIS — Z3A14 14 weeks gestation of pregnancy: Secondary | ICD-10-CM

## 2021-07-14 LAB — POCT URINALYSIS DIPSTICK OB
Bilirubin, UA: NEGATIVE
Blood, UA: NEGATIVE
Glucose, UA: NEGATIVE
Ketones, UA: NEGATIVE
Leukocytes, UA: NEGATIVE
Nitrite, UA: NEGATIVE
POC,PROTEIN,UA: NEGATIVE
Spec Grav, UA: 1.015 (ref 1.010–1.025)
Urobilinogen, UA: 0.2 E.U./dL
pH, UA: 6.5 (ref 5.0–8.0)

## 2021-07-14 NOTE — Progress Notes (Signed)
NOB: Doing well.  Normal genetic testing.  Pap done today.  Advised use of ASA - 81.  Consider AFP next visit Physical examination General NAD, Conversant  HEENT Atraumatic; Op clear with mmm.  Normo-cephalic. Pupils reactive. Anicteric sclerae  Thyroid/Neck Smooth without nodularity or enlargement. Normal ROM.  Neck Supple.  Skin No rashes, lesions or ulceration. Normal palpated skin turgor. No nodularity.  Breasts: No masses or discharge.  Symmetric.  No axillary adenopathy.  Lungs: Clear to auscultation.No rales or wheezes. Normal Respiratory effort, no retractions.  Heart: NSR.  No murmurs or rubs appreciated. No periferal edema  Abdomen: Soft.  Non-tender.  No masses.  No HSM. No hernia  Extremities: Moves all appropriately.  Normal ROM for age. No lymphadenopathy.  Neuro: Oriented to PPT.  Normal mood. Normal affect.     Pelvic:   Vulva: Normal appearance.  No lesions.  Vagina: No lesions or abnormalities noted.  Support: Normal pelvic support.  Urethra No masses tenderness or scarring.  Meatus Normal size without lesions or prolapse.  Cervix: Normal appearance.  No lesions.  Anus: Normal exam.  No lesions.  Perineum: Normal exam.  No lesions.        Bimanual   Adnexae: No masses.  Non-tender to palpation.  Uterus: Enlarged.  158 bpm non-tender.  Mobile.  AV.  Adnexae: No masses.  Non-tender to palpation.  Cul-de-sac: Negative for abnormality.  Adnexae: No masses.  Non-tender to palpation.         Pelvimetry   Diagonal: Reached.  Spines: Average.  Sacrum: Concave.  Pubic Arch: Normal.

## 2021-07-18 LAB — CYTOLOGY - PAP: Diagnosis: NEGATIVE

## 2021-08-17 ENCOUNTER — Ambulatory Visit (INDEPENDENT_AMBULATORY_CARE_PROVIDER_SITE_OTHER): Payer: Medicaid Other | Admitting: Obstetrics and Gynecology

## 2021-08-17 ENCOUNTER — Encounter: Payer: Self-pay | Admitting: Obstetrics and Gynecology

## 2021-08-17 ENCOUNTER — Other Ambulatory Visit: Payer: Self-pay

## 2021-08-17 VITALS — BP 125/76 | HR 96 | Wt 201.0 lb

## 2021-08-17 DIAGNOSIS — F5089 Other specified eating disorder: Secondary | ICD-10-CM

## 2021-08-17 DIAGNOSIS — Z3A19 19 weeks gestation of pregnancy: Secondary | ICD-10-CM

## 2021-08-17 DIAGNOSIS — O0992 Supervision of high risk pregnancy, unspecified, second trimester: Secondary | ICD-10-CM

## 2021-08-17 DIAGNOSIS — O09292 Supervision of pregnancy with other poor reproductive or obstetric history, second trimester: Secondary | ICD-10-CM | POA: Insufficient documentation

## 2021-08-17 LAB — POCT URINALYSIS DIPSTICK OB
Bilirubin, UA: NEGATIVE
Glucose, UA: NEGATIVE
Ketones, UA: NEGATIVE
Leukocytes, UA: NEGATIVE
Nitrite, UA: NEGATIVE
Spec Grav, UA: 1.025 (ref 1.010–1.025)
Urobilinogen, UA: 0.2 E.U./dL
pH, UA: 6.5 (ref 5.0–8.0)

## 2021-08-17 NOTE — Progress Notes (Signed)
ROB: She is doing good, no concerns.

## 2021-08-17 NOTE — Progress Notes (Signed)
ROB: Patient without complaints today. Normal genetic screen with MaterniT21. For AFP today. Patient notes she has been craving ice, desires to have Hgb checked. Is taking a daily OTC iron supplement. Will order.  Plans to breastfeed. RTC in 1 week for anatomy scan, and 4 weeks for OB visit.

## 2021-08-23 LAB — AFP, SERUM, OPEN SPINA BIFIDA
AFP MoM: 1.08
AFP Value: 57.7 ng/mL
Gest. Age on Collection Date: 19.9 weeks
Maternal Age At EDD: 26.3 yr
OSBR Risk 1 IN: 10000
Test Results:: NEGATIVE
Weight: 201 [lb_av]

## 2021-08-23 LAB — HEMOGLOBIN AND HEMATOCRIT, BLOOD
Hematocrit: 31.5 % — ABNORMAL LOW (ref 34.0–46.6)
Hemoglobin: 10.6 g/dL — ABNORMAL LOW (ref 11.1–15.9)

## 2021-09-05 ENCOUNTER — Ambulatory Visit (INDEPENDENT_AMBULATORY_CARE_PROVIDER_SITE_OTHER): Payer: BC Managed Care – PPO

## 2021-09-05 ENCOUNTER — Other Ambulatory Visit: Payer: Self-pay

## 2021-09-05 DIAGNOSIS — O0992 Supervision of high risk pregnancy, unspecified, second trimester: Secondary | ICD-10-CM | POA: Diagnosis not present

## 2021-09-14 ENCOUNTER — Encounter: Payer: Self-pay | Admitting: Obstetrics and Gynecology

## 2021-09-14 ENCOUNTER — Ambulatory Visit (INDEPENDENT_AMBULATORY_CARE_PROVIDER_SITE_OTHER): Payer: Medicaid Other | Admitting: Obstetrics and Gynecology

## 2021-09-14 ENCOUNTER — Other Ambulatory Visit: Payer: Self-pay

## 2021-09-14 VITALS — BP 116/75 | HR 93 | Wt 208.1 lb

## 2021-09-14 DIAGNOSIS — Z3482 Encounter for supervision of other normal pregnancy, second trimester: Secondary | ICD-10-CM

## 2021-09-14 DIAGNOSIS — Z3A23 23 weeks gestation of pregnancy: Secondary | ICD-10-CM

## 2021-09-14 LAB — POCT URINALYSIS DIPSTICK OB
Bilirubin, UA: NEGATIVE
Glucose, UA: NEGATIVE
Ketones, UA: NEGATIVE
Leukocytes, UA: NEGATIVE
Nitrite, UA: NEGATIVE
Spec Grav, UA: 1.02 (ref 1.010–1.025)
Urobilinogen, UA: 0.2 E.U./dL
pH, UA: 6 (ref 5.0–8.0)

## 2021-09-14 NOTE — Progress Notes (Signed)
ROB. Patient reports slight discomfort in rib cage after standing for long periods of time. Patient states she is having trouble staying asleep throughout the night.

## 2021-09-14 NOTE — Progress Notes (Signed)
ROB: Reports difficulty sleeping at night.  We have discussed limiting caffeine in the evening as well as not eating right before bedtime.  Body pillow and other strategic factors discussed.  Patient also has occasional left-sided rib pain.  Possible intercostal changes.  Reports daily fetal movement.  GCT next visit.

## 2021-10-16 NOTE — L&D Delivery Note (Signed)
Delivery Summary for Natalie Pierce ? ?Labor Events:   ?Preterm labor: No data found  ?Rupture date: 01/04/2022  ?Rupture time: 1:05 PM  ?Rupture type: Spontaneous  ?Fluid Color: Clear ?Manson Passey  ?Induction: No data found  ?Augmentation: No data found  ?Complications: No data found  ?Cervical ripening: No data found No data found  ? No data found  ?   ?Delivery:   ?Episiotomy: No data found  ?Lacerations: No data found  ?Repair suture: No data found  ?Repair # of packets: No data found  ?Blood loss (ml): 150  ? ?Information for the patient's newborn:  Alva, Kuenzel [[id number]]  ? ?Delivery ?01/04/2022 1:24 PM by  Vaginal, Spontaneous ?Sex:  female Gestational Age: [redacted]w[redacted]d ?Delivery Clinician:   ?Living?:  ? ?      APGARS  One minute Five minutes Ten minutes  ?Skin color:        ?Heart rate:        ?Grimace:        ?Muscle tone:        ?Breathing:        ?Totals: 9  9     ? ?Presentation/position:      ?Resuscitation:   ?Cord information:    Disposition of cord blood:     Blood gases sent?  ?Complications:   ?Placenta: Delivered:       appearance ?Newborn Measurements: ?Weight: 8 lb 1.1 oz (3660 g)  Height: 20.87"  Head circumference:    Chest circumference:    ?Other providers:    ?Additional  information: ?Forceps:   ?Vacuum:   ?Breech:   ?Observed anomalies   ? ? ? ? ?Delivery Note ?At 1:24 PM a viable and healthy female was delivered via Vaginal, Spontaneous (Presentation:  Vertex; LOA position).  APGAR: 9, 9; weight  3660 grams.   ?Placenta status: Spontaneous, Intact.  Cord: 3 vessels with the following complications: None.  Cord pH: not obtained.   Delayed cord clamping observed.  ? ?Anesthesia: Epidural ?Episiotomy: None ?Lacerations:  1st  ?Suture Repair:  None ?Est. Blood Loss (mL):  150 ml ? ?Mom to postpartum.  Baby to Couplet care / Skin to Skin. ? ?Hildred Laser, MD ?01/04/2022, 1:42 PM ? ?

## 2021-10-19 ENCOUNTER — Ambulatory Visit (INDEPENDENT_AMBULATORY_CARE_PROVIDER_SITE_OTHER): Payer: BC Managed Care – PPO | Admitting: Obstetrics and Gynecology

## 2021-10-19 ENCOUNTER — Other Ambulatory Visit: Payer: Medicaid Other

## 2021-10-19 ENCOUNTER — Other Ambulatory Visit: Payer: Self-pay

## 2021-10-19 VITALS — BP 116/76 | HR 107 | Wt 209.0 lb

## 2021-10-19 DIAGNOSIS — Z23 Encounter for immunization: Secondary | ICD-10-CM

## 2021-10-19 DIAGNOSIS — O09293 Supervision of pregnancy with other poor reproductive or obstetric history, third trimester: Secondary | ICD-10-CM | POA: Diagnosis not present

## 2021-10-19 DIAGNOSIS — Z3A28 28 weeks gestation of pregnancy: Secondary | ICD-10-CM

## 2021-10-19 DIAGNOSIS — Z113 Encounter for screening for infections with a predominantly sexual mode of transmission: Secondary | ICD-10-CM

## 2021-10-19 DIAGNOSIS — Z131 Encounter for screening for diabetes mellitus: Secondary | ICD-10-CM

## 2021-10-19 DIAGNOSIS — Z3480 Encounter for supervision of other normal pregnancy, unspecified trimester: Secondary | ICD-10-CM

## 2021-10-19 LAB — POCT URINALYSIS DIPSTICK OB
Bilirubin, UA: NEGATIVE
Blood, UA: NEGATIVE
Glucose, UA: NEGATIVE
Ketones, UA: NEGATIVE
Leukocytes, UA: NEGATIVE
Nitrite, UA: NEGATIVE
POC,PROTEIN,UA: NEGATIVE
Spec Grav, UA: 1.025 (ref 1.010–1.025)
Urobilinogen, UA: 0.2 E.U./dL
pH, UA: 6 (ref 5.0–8.0)

## 2021-10-19 NOTE — Progress Notes (Deleted)
ROB

## 2021-10-19 NOTE — Progress Notes (Signed)
ROB: Patient doing well, no issues.  Patient has questions about IOL due to history. Discussed that plan would be for IOL at 39 weeks.  Would begin antenatal 3-4 weeks prior with growth scan due to h/o IUGR. Discussed birth plan, doula support. For 28 week labs today.  Plans to breastfeed (see topics), desires  NuvaRing  for contraception. For Tdap today, signed blood consent.  The patient has Medicaid.  [facility] Medicaid Risk Screening Form completed today.

## 2021-10-19 NOTE — Patient Instructions (Signed)
DOULA SERVICES  Please email [place] at: [email or web address]. We do have a phone number, but it is a voicemail only, [phone]. We can then call the client back and collect info from them to ensure they qualify for no-cost doula services and match them with a doula.   You may also complete a Request Form online.  Please view the Doula Request Form ( [email or web address]) and submit it if you meet the qualifying criteria listed within the form. If you do not meet the criteria for the program to receive a doula at no cost, we have attached a list of private doulas who are credentialed with [facility] that you can connect with and retain for your birth.   Currently, our program is only for birth, but most of our doulas connect with clients and provide support in the prenatal period as well.

## 2021-10-20 LAB — CBC
Hematocrit: 30.5 % — ABNORMAL LOW (ref 34.0–46.6)
Hemoglobin: 10.2 g/dL — ABNORMAL LOW (ref 11.1–15.9)
MCH: 28.5 pg (ref 26.6–33.0)
MCHC: 33.4 g/dL (ref 31.5–35.7)
MCV: 85 fL (ref 79–97)
Platelets: 243 10*3/uL (ref 150–450)
RBC: 3.58 x10E6/uL — ABNORMAL LOW (ref 3.77–5.28)
RDW: 14 % (ref 11.7–15.4)
WBC: 10 10*3/uL (ref 3.4–10.8)

## 2021-10-20 LAB — RPR: RPR Ser Ql: NONREACTIVE

## 2021-10-20 LAB — GLUCOSE, 1 HOUR GESTATIONAL: Gestational Diabetes Screen: 87 mg/dL (ref 70–139)

## 2021-11-02 ENCOUNTER — Ambulatory Visit (INDEPENDENT_AMBULATORY_CARE_PROVIDER_SITE_OTHER): Payer: BC Managed Care – PPO | Admitting: Obstetrics and Gynecology

## 2021-11-02 ENCOUNTER — Other Ambulatory Visit: Payer: Self-pay

## 2021-11-02 ENCOUNTER — Encounter: Payer: Self-pay | Admitting: Obstetrics and Gynecology

## 2021-11-02 VITALS — BP 122/79 | HR 94 | Wt 209.0 lb

## 2021-11-02 DIAGNOSIS — Z3483 Encounter for supervision of other normal pregnancy, third trimester: Secondary | ICD-10-CM

## 2021-11-02 DIAGNOSIS — Z3A30 30 weeks gestation of pregnancy: Secondary | ICD-10-CM

## 2021-11-02 LAB — POCT URINALYSIS DIPSTICK OB
Blood, UA: NEGATIVE
Glucose, UA: NEGATIVE
Leukocytes, UA: NEGATIVE
POC,PROTEIN,UA: NEGATIVE
Spec Grav, UA: 1.015 (ref 1.010–1.025)
pH, UA: 6 (ref 5.0–8.0)

## 2021-11-02 NOTE — Progress Notes (Signed)
ROB: Feeling a little more tired and a little more pressure while at work now that she is in the third trimester.  She is otherwise well.  Taking prenatal vitamins and aspirin as directed.  We have discussed ultrasound at approximately 34 weeks followed by antenatal testing beginning approximately 36 weeks.  Questions regarding ultrasound and NST answered.  Daily movement/kick counts discussed.

## 2021-11-16 ENCOUNTER — Ambulatory Visit (INDEPENDENT_AMBULATORY_CARE_PROVIDER_SITE_OTHER): Payer: BC Managed Care – PPO | Admitting: Obstetrics and Gynecology

## 2021-11-16 ENCOUNTER — Other Ambulatory Visit: Payer: Self-pay

## 2021-11-16 VITALS — BP 129/82 | HR 113 | Wt 210.0 lb

## 2021-11-16 DIAGNOSIS — Z3480 Encounter for supervision of other normal pregnancy, unspecified trimester: Secondary | ICD-10-CM

## 2021-11-16 DIAGNOSIS — O09293 Supervision of pregnancy with other poor reproductive or obstetric history, third trimester: Secondary | ICD-10-CM

## 2021-11-16 LAB — POCT URINALYSIS DIPSTICK OB
Bilirubin, UA: 0.2
Blood, UA: NEGATIVE
Glucose, UA: NEGATIVE
Ketones, UA: NEGATIVE
Leukocytes, UA: NEGATIVE
Nitrite, UA: NEGATIVE
POC,PROTEIN,UA: NEGATIVE
Spec Grav, UA: 1.015 (ref 1.010–1.025)
Urobilinogen, UA: NEGATIVE E.U./dL — AB
pH, UA: 7.5 (ref 5.0–8.0)

## 2021-11-16 NOTE — Progress Notes (Signed)
ROB: Notes some rhythymic movement from her baby at times, thinks it may be hiccups. Discussed doula program further, signed up today. RTC in 2 weeks, for growth scan at that time.

## 2021-11-30 ENCOUNTER — Other Ambulatory Visit: Payer: Self-pay

## 2021-11-30 ENCOUNTER — Ambulatory Visit (INDEPENDENT_AMBULATORY_CARE_PROVIDER_SITE_OTHER): Payer: BC Managed Care – PPO

## 2021-11-30 ENCOUNTER — Ambulatory Visit (INDEPENDENT_AMBULATORY_CARE_PROVIDER_SITE_OTHER): Payer: BC Managed Care – PPO | Admitting: Obstetrics and Gynecology

## 2021-11-30 ENCOUNTER — Encounter: Payer: Self-pay | Admitting: Obstetrics and Gynecology

## 2021-11-30 VITALS — BP 123/76 | HR 96 | Wt 214.1 lb

## 2021-11-30 DIAGNOSIS — O09293 Supervision of pregnancy with other poor reproductive or obstetric history, third trimester: Secondary | ICD-10-CM | POA: Diagnosis not present

## 2021-11-30 DIAGNOSIS — Z3480 Encounter for supervision of other normal pregnancy, unspecified trimester: Secondary | ICD-10-CM

## 2021-11-30 DIAGNOSIS — Z3A34 34 weeks gestation of pregnancy: Secondary | ICD-10-CM

## 2021-11-30 DIAGNOSIS — Z3483 Encounter for supervision of other normal pregnancy, third trimester: Secondary | ICD-10-CM

## 2021-11-30 LAB — POCT URINALYSIS DIPSTICK OB
Bilirubin, UA: NEGATIVE
Blood, UA: NEGATIVE
Glucose, UA: NEGATIVE
Ketones, UA: NEGATIVE
Leukocytes, UA: NEGATIVE
Nitrite, UA: NEGATIVE
POC,PROTEIN,UA: NEGATIVE
Spec Grav, UA: <=1.005 — AB (ref 1.010–1.025)
Urobilinogen, UA: 0.2 E.U./dL
pH, UA: 7 (ref 5.0–8.0)

## 2021-11-30 NOTE — Progress Notes (Signed)
ROB: No complaints today.  Feels daily fetal movement.  Begin NSTs next visit.  GC/CT-GBS next visit.

## 2021-11-30 NOTE — Progress Notes (Signed)
ROB. Patient states fetal movement with pelvic pressure. Patient states no questions or concerns.

## 2021-12-14 ENCOUNTER — Other Ambulatory Visit: Payer: Self-pay

## 2021-12-14 ENCOUNTER — Other Ambulatory Visit: Payer: BC Managed Care – PPO

## 2021-12-14 ENCOUNTER — Ambulatory Visit (INDEPENDENT_AMBULATORY_CARE_PROVIDER_SITE_OTHER): Payer: BC Managed Care – PPO | Admitting: Obstetrics and Gynecology

## 2021-12-14 ENCOUNTER — Encounter: Payer: Self-pay | Admitting: Obstetrics and Gynecology

## 2021-12-14 VITALS — BP 125/75 | HR 105 | Wt 214.0 lb

## 2021-12-14 DIAGNOSIS — Z3483 Encounter for supervision of other normal pregnancy, third trimester: Secondary | ICD-10-CM

## 2021-12-14 DIAGNOSIS — O364XX0 Maternal care for intrauterine death, not applicable or unspecified: Secondary | ICD-10-CM

## 2021-12-14 DIAGNOSIS — Z3A36 36 weeks gestation of pregnancy: Secondary | ICD-10-CM

## 2021-12-14 DIAGNOSIS — Z3685 Encounter for antenatal screening for Streptococcus B: Secondary | ICD-10-CM

## 2021-12-14 DIAGNOSIS — Z113 Encounter for screening for infections with a predominantly sexual mode of transmission: Secondary | ICD-10-CM

## 2021-12-14 LAB — OB RESULTS CONSOLE GC/CHLAMYDIA: Gonorrhea: NEGATIVE

## 2021-12-14 NOTE — Progress Notes (Signed)
ROB: She is doing well. No new concerns today. Cultures done today. °

## 2021-12-14 NOTE — Progress Notes (Signed)
ROB: Patient notes some difficulty with walking at times due to pelvic pressure. Advised on belly band. 36 week cultures today. Patient desires to know when her IOL will be. Discussed IOL scheduling policy.  Discussed desired date, tentatively will plan for 3/22.  To schedule when appropriate. NST performed today was reviewed and was found to be reactive.  Continue recommended antenatal testing and prenatal care. RTC in 1 week.  ? ? ?NONSTRESS TEST INTERPRETATION ? ?INDICATIONS:  History of IUFD at 39 weeks ? ?FHR baseline: 135 bpm ?RESULTS:Reactive ?COMMENTS: Uterine irritability with occasional contractions ? ? ?PLAN: ?1. Continue fetal kick counts twice a day. ?2. Continue antepartum testing as scheduled-weekly ? ? ?

## 2021-12-15 ENCOUNTER — Other Ambulatory Visit: Payer: BC Managed Care – PPO

## 2021-12-15 ENCOUNTER — Encounter: Payer: BC Managed Care – PPO | Admitting: Obstetrics and Gynecology

## 2021-12-16 LAB — GC/CHLAMYDIA PROBE AMP
Chlamydia trachomatis, NAA: NEGATIVE
Neisseria Gonorrhoeae by PCR: NEGATIVE

## 2021-12-16 LAB — STREP GP B NAA: Strep Gp B NAA: NEGATIVE

## 2021-12-21 ENCOUNTER — Ambulatory Visit (INDEPENDENT_AMBULATORY_CARE_PROVIDER_SITE_OTHER): Payer: BC Managed Care – PPO | Admitting: Obstetrics and Gynecology

## 2021-12-21 ENCOUNTER — Other Ambulatory Visit: Payer: Self-pay

## 2021-12-21 ENCOUNTER — Encounter: Payer: Self-pay | Admitting: Obstetrics and Gynecology

## 2021-12-21 ENCOUNTER — Other Ambulatory Visit: Payer: BC Managed Care – PPO

## 2021-12-21 VITALS — BP 127/68 | HR 97 | Wt 215.6 lb

## 2021-12-21 DIAGNOSIS — Z3A37 37 weeks gestation of pregnancy: Secondary | ICD-10-CM

## 2021-12-21 DIAGNOSIS — Z3483 Encounter for supervision of other normal pregnancy, third trimester: Secondary | ICD-10-CM

## 2021-12-21 LAB — POCT URINALYSIS DIPSTICK OB
Bilirubin, UA: NEGATIVE
Blood, UA: NEGATIVE
Glucose, UA: NEGATIVE
Ketones, UA: NEGATIVE
Leukocytes, UA: NEGATIVE
Nitrite, UA: NEGATIVE
POC,PROTEIN,UA: NEGATIVE
Spec Grav, UA: 1.01 (ref 1.010–1.025)
Urobilinogen, UA: 0.2 E.U./dL
pH, UA: 7 (ref 5.0–8.0)

## 2021-12-21 NOTE — Progress Notes (Signed)
ROB. Patient states fetal movement with increased pelvic pain. Patient receiving NST today. Patient states no questions or concerns at this time.   ?

## 2021-12-21 NOTE — Progress Notes (Signed)
ROB: Reports daily fetal movement.  Denies problems.  NST reactive today.  Irregular mild contractions-patient not feeling.  Induction at 39 weeks before her EDC discussed. ?

## 2021-12-30 ENCOUNTER — Ambulatory Visit (INDEPENDENT_AMBULATORY_CARE_PROVIDER_SITE_OTHER): Payer: BC Managed Care – PPO | Admitting: Obstetrics and Gynecology

## 2021-12-30 ENCOUNTER — Other Ambulatory Visit: Payer: BC Managed Care – PPO

## 2021-12-30 ENCOUNTER — Encounter: Payer: Self-pay | Admitting: Obstetrics and Gynecology

## 2021-12-30 ENCOUNTER — Other Ambulatory Visit: Payer: Self-pay

## 2021-12-30 VITALS — BP 119/68 | HR 86 | Wt 217.2 lb

## 2021-12-30 DIAGNOSIS — Z3483 Encounter for supervision of other normal pregnancy, third trimester: Secondary | ICD-10-CM | POA: Diagnosis not present

## 2021-12-30 DIAGNOSIS — O09293 Supervision of pregnancy with other poor reproductive or obstetric history, third trimester: Secondary | ICD-10-CM | POA: Insufficient documentation

## 2021-12-30 DIAGNOSIS — Z3A38 38 weeks gestation of pregnancy: Secondary | ICD-10-CM

## 2021-12-30 LAB — POCT URINALYSIS DIPSTICK OB
Bilirubin, UA: NEGATIVE
Blood, UA: NEGATIVE
Glucose, UA: NEGATIVE
Ketones, UA: NEGATIVE
Leukocytes, UA: NEGATIVE
Nitrite, UA: NEGATIVE
Spec Grav, UA: 1.01 (ref 1.010–1.025)
Urobilinogen, UA: 0.2 E.U./dL
pH, UA: 7.5 (ref 5.0–8.0)

## 2021-12-30 NOTE — Progress Notes (Addendum)
ROB: Patient doing well, no complaints. NST performed today was reviewed and was found to be reactive.  Discussed IOL for h/o term IUFD.  Scheduled for 3/22.  Discussed labor precautions, also reviewed fetal kick counts.  ? ? ? ?NONSTRESS TEST INTERPRETATION ? ?INDICATIONS:  history of term IUFD ? ?FHR baseline: 135 bpm ?RESULTS:Reactive (with acoustic stimulator) ?COMMENTS: Occasional contractions (not detectable by patient) ? ? ?PLAN: ?1. Continue fetal kick counts twice a day. ?2. Scheduled for IOL next week, 3/22.  ? ? ?

## 2021-12-30 NOTE — Patient Instructions (Signed)
? ? ?Instructions for Scheduled Procedure (Inductions/C-sections and GYN surgeries) ? ? ?Thank you for choosing [facility] for your services.  You have been scheduled for a procedure called _____Induction of Labor______________.   ? ?Your procedure is scheduled on ______March 22, 2023 at midnight (arrive Tuesday night)_______. ? ?Upon your scheduled procedure date, you will need to arrive at the Emergency Room entrance.  ? ?Please arrive on time if you are scheduled for an induction of labor.   ?If you are scheduled for a Cesarean delivery or for Gyn Surgery, arrive 2 hours prior to your procedure time.   ? ?At this time, patients are allowed 2 support persons to accompany them. Face masks are required for you and your support person(s). Your support person(s) are now allowed to be there with you during the entire time of your admission.  ? ?Please contact the office if you have any questions regarding this information.  The [facility] office number is [phone].  ? ? ? ?Thank you,  ? ? ?Your [facility] Providers  ? ? ? ? ?Labor Induction ?Labor induction is when steps are taken to cause a pregnant woman to begin the labor process. Most women go into labor on their own between 37 weeks and 42 weeks of pregnancy. When this does not happen, or when there is a medical need for labor to begin, steps may be taken to induce, or bring on, labor. ?Labor induction causes a pregnant woman's uterus to contract. It also causes the cervix to soften (ripen), open (dilate), and thin out. Usually, labor is not induced before 39 weeks of pregnancy unless there is a medical reason to do so. ?When is labor induction considered? ?Labor induction may be right for you if: ?Your pregnancy lasts longer than 41 to 42 weeks. ?Your placenta is separating from your uterus (placental abruption). ?You have a rupture of membranes and your labor does not begin. ?You have health problems, like diabetes or high blood pressure  (preeclampsia) during your pregnancy. ?Your baby has stopped growing or does not have enough amniotic fluid. ?Before labor induction begins, your health care provider will consider the following factors: ?Your medical condition and the baby's condition. ?How many weeks you have been pregnant. ?How mature the baby's lungs are. ?The condition of your cervix. ?The position of the baby. ?The size of your birth canal. ?Tell a health care provider about: ?Any allergies you have. ?All medicines you are taking, including vitamins, herbs, eye drops, creams, and over-the-counter medicines. ?Any problems you or your family members have had with anesthetic medicines. ?Any surgeries you have had. ?Any blood disorders you have. ?Any medical conditions you have. ?What are the risks? ?Generally, this is a safe procedure. However, problems may occur, including: ?Failed induction. ?Changes in fetal heart rate, such as being too high, too low, or irregular (erratic). ?Infection in the mother or the baby. ?Increased risk of having a cesarean delivery. ?Breaking off (abruption) of the placenta from the uterus. This is rare. ?Rupture of the uterus. This is very rare. ?Your baby could fail to get enough blood flow or oxygen. This can be life-threatening. ?When induction is needed for medical reasons, the benefits generally outweigh the risks. ?What happens during the procedure? ?During the procedure, your health care provider will use one of these methods to induce labor: ?Stripping the membranes. In this method, the amniotic sac tissue is gently separated from the cervix. This causes the following to happen: ?Your cervix stretches, which in turn  causes the release of prostaglandins. ?Prostaglandins induce labor and cause the uterus to contract. ?This procedure is often done in an office visit. You will be sent home to wait for contractions to begin. ?Prostaglandin medicine. This medicine starts contractions and causes the cervix to  dilate and ripen. This can be taken by mouth (orally) or by being inserted into the vagina (suppository). ?Inserting a small, thin tube (catheter) with a balloon into the vagina and then expanding the balloon with water to dilate the cervix. ?Breaking the water. In this method, a small instrument is used to make a small hole in the amniotic sac. This eventually causes the amniotic sac to break. Contractions should begin within a few hours. ?Medicine to trigger or strengthen contractions. This medicine is given through an IV that is inserted into a vein in your arm. ?This procedure may vary among health care providers and hospitals. ?Where to find more information ?March of Dimes: www.marchofdimes.org ?[employer] of Obstetricians and Gynecologists: www.acog.org ?Summary ?Labor induction causes a pregnant woman's uterus to contract. It also causes the cervix to soften (ripen), open (dilate), and thin out. ?Labor is usually not induced before 39 weeks of pregnancy unless there is a medical reason to do so. ?When induction is needed for medical reasons, the benefits generally outweigh the risks. ?Talk with your health care provider about which methods of labor induction are right for you. ?This information is not intended to replace advice given to you by your health care provider. Make sure you discuss any questions you have with your health care provider. ?Document Revised: 07/15/2020 Document Reviewed: 07/15/2020 ?Elsevier Patient Education ? 2022 Elsevier Inc. ? ? ? ? ?

## 2021-12-30 NOTE — Progress Notes (Signed)
ROB. Patient states fetal movement with pressure. She states she would like her cervix checked today. Patient also having NST today. Patient states no questions or concerns at this time.  ? ?

## 2022-01-01 ENCOUNTER — Encounter: Payer: Self-pay | Admitting: Obstetrics and Gynecology

## 2022-01-01 ENCOUNTER — Observation Stay (HOSPITAL_BASED_OUTPATIENT_CLINIC_OR_DEPARTMENT_OTHER)
Admission: EM | Admit: 2022-01-01 | Discharge: 2022-01-01 | Disposition: A | Discharge status: Home / Self Care | Payer: BC Managed Care – PPO | Source: Home / Self Care | Admitting: Obstetrics and Gynecology

## 2022-01-01 ENCOUNTER — Other Ambulatory Visit: Payer: Self-pay

## 2022-01-01 DIAGNOSIS — O368130 Decreased fetal movements, third trimester, not applicable or unspecified: Secondary | ICD-10-CM | POA: Insufficient documentation

## 2022-01-01 DIAGNOSIS — Z3A38 38 weeks gestation of pregnancy: Secondary | ICD-10-CM | POA: Insufficient documentation

## 2022-01-01 DIAGNOSIS — O09293 Supervision of pregnancy with other poor reproductive or obstetric history, third trimester: Secondary | ICD-10-CM

## 2022-01-01 DIAGNOSIS — Z3490 Encounter for supervision of normal pregnancy, unspecified, unspecified trimester: Secondary | ICD-10-CM

## 2022-01-01 NOTE — Discharge Instructions (Signed)
Please keep next scheduled appointment.  If you have questions or concerns you may call your on call provider. ?

## 2022-01-01 NOTE — OB Triage Note (Signed)
Patient to OBS3 with complaint of decreased fetal movement.  Patient awoke from taking a nap around 1:30 this afternoon and was noticing that she wasn't feeling her baby move.  Patient arrived with her mother and was crying and upset.  Monitors applied and fhr noted.  Monitors applied and assessing.  Patient denies LOF and bleeding. ?

## 2022-01-02 DIAGNOSIS — O368130 Decreased fetal movements, third trimester, not applicable or unspecified: Secondary | ICD-10-CM

## 2022-01-02 NOTE — Discharge Summary (Signed)
L&D OB Triage Note ? ?Natalie Pierce is a 26 y.o. G58P2001 female at [redacted]w[redacted]d, EDD Estimated Date of Delivery: 01/11/22 who presented to triage for complaints of decreased fetal movement x 1 day. Of note, patient does have a h/o prior pregnancy loss at term.  She was evaluated by the nurses with no significant findings. Vital signs stable. An NST was performed and has been reviewed by MD.  ? ?NST INTERPRETATION: ?Indications: decreased fetal movement ? ?Mode: External (applied and assessing) ?Baseline Rate (A): 145 bpm (fht) ?Accelerations: Present ?Decelerations: absent ?  ?  ?Tocometer: uterine irritability with rare contraction ?  ? ?Impression: reactive ? ? ?Plan: NST performed was reviewed and was found to be reactive. She was discharged home with labor precautions and fetal kick count instructions.  Continue routine prenatal care. Is scheduled for IOL in 3 days.  ? ? ? ?Hildred Laser, MD ?[facility] ? ?

## 2022-01-04 ENCOUNTER — Inpatient Hospital Stay: Payer: BC Managed Care – PPO | Admitting: Certified Registered"

## 2022-01-04 ENCOUNTER — Other Ambulatory Visit: Payer: Self-pay

## 2022-01-04 ENCOUNTER — Encounter: Payer: Self-pay | Admitting: Obstetrics and Gynecology

## 2022-01-04 ENCOUNTER — Inpatient Hospital Stay
Admission: EM | Admit: 2022-01-04 | Discharge: 2022-01-06 | DRG: 807 | Disposition: A | Discharge status: Home / Self Care | Payer: BC Managed Care – PPO | Attending: Obstetrics and Gynecology | Admitting: Obstetrics and Gynecology

## 2022-01-04 DIAGNOSIS — Z8759 Personal history of other complications of pregnancy, childbirth and the puerperium: Secondary | ICD-10-CM

## 2022-01-04 DIAGNOSIS — Z3A39 39 weeks gestation of pregnancy: Secondary | ICD-10-CM

## 2022-01-04 DIAGNOSIS — O368130 Decreased fetal movements, third trimester, not applicable or unspecified: Secondary | ICD-10-CM | POA: Diagnosis present

## 2022-01-04 DIAGNOSIS — Z3490 Encounter for supervision of normal pregnancy, unspecified, unspecified trimester: Secondary | ICD-10-CM

## 2022-01-04 DIAGNOSIS — O26893 Other specified pregnancy related conditions, third trimester: Principal | ICD-10-CM | POA: Diagnosis present

## 2022-01-04 DIAGNOSIS — Z3480 Encounter for supervision of other normal pregnancy, unspecified trimester: Secondary | ICD-10-CM

## 2022-01-04 DIAGNOSIS — O09293 Supervision of pregnancy with other poor reproductive or obstetric history, third trimester: Secondary | ICD-10-CM

## 2022-01-04 LAB — CBC
HCT: 28.7 % — ABNORMAL LOW (ref 36.0–46.0)
Hemoglobin: 9.6 g/dL — ABNORMAL LOW (ref 12.0–15.0)
MCH: 27.8 pg (ref 26.0–34.0)
MCHC: 33.4 g/dL (ref 30.0–36.0)
MCV: 83.2 fL (ref 80.0–100.0)
Platelets: 228 10*3/uL (ref 150–400)
RBC: 3.45 MIL/uL — ABNORMAL LOW (ref 3.87–5.11)
RDW: 16.7 % — ABNORMAL HIGH (ref 11.5–15.5)
WBC: 9.3 10*3/uL (ref 4.0–10.5)
nRBC: 0 % (ref 0.0–0.2)

## 2022-01-04 LAB — TYPE AND SCREEN
ABO/RH(D): O POS
Antibody Screen: NEGATIVE

## 2022-01-04 LAB — RPR: RPR Ser Ql: NONREACTIVE

## 2022-01-04 MED ORDER — FENTANYL-BUPIVACAINE-NACL 0.5-0.125-0.9 MG/250ML-% EP SOLN
EPIDURAL | Status: AC
  Filled 2022-01-04: qty 250

## 2022-01-04 MED ORDER — ACETAMINOPHEN 325 MG PO TABS: 650.0000 mg | ORAL_TABLET | ORAL | Status: DC | PRN

## 2022-01-04 MED ORDER — BENZOCAINE-MENTHOL 20-0.5 % EX AERO
1.0000 "application " | INHALATION_SPRAY | CUTANEOUS | Status: DC | PRN
  Administered 2022-01-04: 1 via TOPICAL
  Filled 2022-01-04: qty 56

## 2022-01-04 MED ORDER — SIMETHICONE 80 MG PO CHEW
80.0000 mg | CHEWABLE_TABLET | ORAL | Status: DC | PRN
  Administered 2022-01-04: 80 mg via ORAL
  Filled 2022-01-04: qty 1

## 2022-01-04 MED ORDER — MISOPROSTOL 200 MCG PO TABS
ORAL_TABLET | ORAL | Status: AC
Start: 2022-01-04 — End: 2022-01-04
  Administered 2022-01-04: 50 ug via VAGINAL
  Filled 2022-01-04: qty 4

## 2022-01-04 MED ORDER — ONDANSETRON HCL 4 MG/2ML IJ SOLN: 4.0000 mg | Freq: Four times a day (QID) | INTRAMUSCULAR | Status: DC | PRN

## 2022-01-04 MED ORDER — LACTATED RINGERS IV BOLUS
1000.0000 mL | Freq: Once | INTRAVENOUS | Status: AC
  Administered 2022-01-04: 1000 mL via INTRAVENOUS

## 2022-01-04 MED ORDER — ONDANSETRON HCL 4 MG/2ML IJ SOLN: 4.0000 mg | INTRAMUSCULAR | Status: DC | PRN

## 2022-01-04 MED ORDER — LIDOCAINE-EPINEPHRINE (PF) 1.5 %-1:200000 IJ SOLN
INTRAMUSCULAR | Status: DC | PRN
  Administered 2022-01-04: 3 mL via EPIDURAL

## 2022-01-04 MED ORDER — ZOLPIDEM TARTRATE 5 MG PO TABS: 5.0000 mg | ORAL_TABLET | Freq: Every evening | ORAL | Status: DC | PRN

## 2022-01-04 MED ORDER — TERBUTALINE SULFATE 1 MG/ML IJ SOLN: 0.2500 mg | Freq: Once | INTRAMUSCULAR | Status: DC | PRN

## 2022-01-04 MED ORDER — ONDANSETRON HCL 4 MG PO TABS: 4.0000 mg | ORAL_TABLET | ORAL | Status: DC | PRN

## 2022-01-04 MED ORDER — LIDOCAINE HCL (PF) 1 % IJ SOLN: 30.0000 mL | INTRAMUSCULAR | Status: DC | PRN

## 2022-01-04 MED ORDER — WITCH HAZEL-GLYCERIN EX PADS: 1.0000 "application " | MEDICATED_PAD | CUTANEOUS | Status: DC | PRN

## 2022-01-04 MED ORDER — LIDOCAINE HCL (PF) 1 % IJ SOLN
INTRAMUSCULAR | Status: DC | PRN
  Administered 2022-01-04: 3 mL via INTRADERMAL

## 2022-01-04 MED ORDER — AMMONIA AROMATIC IN INHA
RESPIRATORY_TRACT | Status: AC
  Filled 2022-01-04: qty 10

## 2022-01-04 MED ORDER — SOD CITRATE-CITRIC ACID 500-334 MG/5ML PO SOLN: 30.0000 mL | ORAL | Status: DC | PRN

## 2022-01-04 MED ORDER — DIBUCAINE (PERIANAL) 1 % EX OINT
1.0000 | TOPICAL_OINTMENT | CUTANEOUS | Status: DC | PRN
Start: 2022-01-04 — End: 2022-01-07
  Administered 2022-01-04: 1 via RECTAL
  Filled 2022-01-04: qty 28

## 2022-01-04 MED ORDER — SENNOSIDES-DOCUSATE SODIUM 8.6-50 MG PO TABS
2.0000 | ORAL_TABLET | Freq: Every day | ORAL | Status: DC
  Administered 2022-01-05 – 2022-01-06 (×2): 2 via ORAL
  Filled 2022-01-04 (×2): qty 2

## 2022-01-04 MED ORDER — ACETAMINOPHEN 325 MG PO TABS
650.0000 mg | ORAL_TABLET | ORAL | Status: DC | PRN
  Administered 2022-01-04: 650 mg via ORAL
  Filled 2022-01-04: qty 2

## 2022-01-04 MED ORDER — IBUPROFEN 600 MG PO TABS
600.0000 mg | ORAL_TABLET | Freq: Four times a day (QID) | ORAL | Status: DC
  Administered 2022-01-04 – 2022-01-06 (×6): 600 mg via ORAL
  Filled 2022-01-04 (×7): qty 1

## 2022-01-04 MED ORDER — OXYTOCIN BOLUS FROM INFUSION
333.0000 mL | Freq: Once | INTRAVENOUS | Status: AC
  Administered 2022-01-04: 333 mL via INTRAVENOUS

## 2022-01-04 MED ORDER — OXYTOCIN 10 UNIT/ML IJ SOLN
INTRAMUSCULAR | Status: AC
Start: 2022-01-04 — End: 2022-01-04
  Filled 2022-01-04: qty 2

## 2022-01-04 MED ORDER — OXYTOCIN-SODIUM CHLORIDE 30-0.9 UT/500ML-% IV SOLN
2.5000 [IU]/h | INTRAVENOUS | Status: DC
  Filled 2022-01-04: qty 500

## 2022-01-04 MED ORDER — DIPHENHYDRAMINE HCL 25 MG PO CAPS: 25.0000 mg | ORAL_CAPSULE | Freq: Four times a day (QID) | ORAL | Status: DC | PRN

## 2022-01-04 MED ORDER — COCONUT OIL OIL
1.0000 "application " | TOPICAL_OIL | Status: DC | PRN
  Administered 2022-01-04: 1 via TOPICAL
  Filled 2022-01-04 (×2): qty 120

## 2022-01-04 MED ORDER — LACTATED RINGERS IV SOLN: 500.0000 mL | INTRAVENOUS | Status: DC | PRN

## 2022-01-04 MED ORDER — OXYCODONE-ACETAMINOPHEN 5-325 MG PO TABS: 2.0000 | ORAL_TABLET | ORAL | Status: DC | PRN

## 2022-01-04 MED ORDER — FENTANYL-BUPIVACAINE-NACL 0.5-0.125-0.9 MG/250ML-% EP SOLN
EPIDURAL | Status: DC | PRN
  Administered 2022-01-04: 12 mL/h via EPIDURAL

## 2022-01-04 MED ORDER — OXYCODONE-ACETAMINOPHEN 5-325 MG PO TABS: 1.0000 | ORAL_TABLET | ORAL | Status: DC | PRN

## 2022-01-04 MED ORDER — BUPIVACAINE HCL (PF) 0.25 % IJ SOLN
INTRAMUSCULAR | Status: DC | PRN
  Administered 2022-01-04 (×2): 5 mL via EPIDURAL

## 2022-01-04 MED ORDER — MISOPROSTOL 50MCG HALF TABLET
50.0000 ug | ORAL_TABLET | ORAL | Status: DC | PRN
  Administered 2022-01-04 (×2): 50 ug via VAGINAL
  Filled 2022-01-04 (×3): qty 1

## 2022-01-04 MED ORDER — LACTATED RINGERS IV SOLN: INTRAVENOUS | Status: DC

## 2022-01-04 MED ORDER — LIDOCAINE HCL (PF) 1 % IJ SOLN
INTRAMUSCULAR | Status: AC
  Filled 2022-01-04: qty 30

## 2022-01-04 MED ORDER — BUTORPHANOL TARTRATE 1 MG/ML IJ SOLN: 1.0000 mg | INTRAMUSCULAR | Status: DC | PRN

## 2022-01-04 MED ORDER — PRENATAL MULTIVITAMIN CH
1.0000 | ORAL_TABLET | Freq: Every day | ORAL | Status: DC
  Administered 2022-01-04 – 2022-01-06 (×3): 1 via ORAL
  Filled 2022-01-04 (×3): qty 1

## 2022-01-04 NOTE — Plan of Care (Signed)

## 2022-01-04 NOTE — H&P (Signed)
Obstetric History and Physical ? ?Marykate Erin FullingD Brundidge is a 26 y.o. G3P2001 with IUP at 702w0d presenting for scheduled IOL for history of prior term pregnancy loss. Patient states she has been having  rare contractions,  no  vaginal bleeding, intact membranes, with active fetal movement.   ? ?Prenatal Course ?Source of Care: [facility] with onset of care at  weeks ?Pregnancy complications or risks: ?Patient Active Problem List  ? Diagnosis Date Noted  ? History of IUFD 01/04/2022  ? Decreased fetus movements affecting management of mother in third trimester   ? Pregnancy 01/01/2022  ? History of pregnancy loss in prior pregnancy, currently pregnant in third trimester 12/30/2021  ? IUFD at 20 weeks or more of gestation 10/15/2018  ? Supervision of other normal pregnancy, antepartum 03/14/2018  ? ?She plans to breastfeed ?She desires NuvaRing vaginal inserts for postpartum contraception.  ? ?Prenatal labs and studies: ?ABO, Rh: --/--/O POS (03/22 0123) ?Antibody: NEG (03/22 0123) ?Rubella: 5.52 (09/16 1125) ?RPR: Non Reactive (01/04 1446)  ?HBsAg: Negative (09/16 1125)  ?HIV: Non Reactive (09/16 1125)  ?ZOX:WRUEAVWU/GBS:Negative/-- (03/01 1650) ?1 hr Glucola  normal ?Genetic screening normal ?Anatomy US normal ? ? ?Past Medical History:  ?Diagnosis Date  ? Anemia   ? ? ?Past Surgical History:  ?Procedure Laterality Date  ? WISDOM TOOTH EXTRACTION    ? ? ?OB History  ?Gravida Para Term Preterm AB Living  ?3 2 2     1   ?SAB IAB Ectopic Multiple Live Births  ?      0 1  ?  ?# Outcome Date GA Lbr Len/2nd Weight Sex Delivery Anes PTL Lv  ?3 Current           ?2 Term 10/15/18 382w1d 07:22 / 01:23 2939 g F Vag-Spont EPI  FD  ?1 Term 10/05/13 1858w2d  3487 g M Vag-Spont   LIV  ? ? ?Social History  ? ?Socioeconomic History  ? Marital status: Single  ?  Spouse name: Not on file  ? Number of children: Not on file  ? Years of education: Not on file  ? Highest education level: Not on file  ?Occupational History  ? Not on file  ?Tobacco  Use  ? Smoking status: Never  ? Smokeless tobacco: Never  ?Vaping Use  ? Vaping Use: Never used  ?Substance and Sexual Activity  ? Alcohol use: Not Currently  ?  Comment: Occ  ? Drug use: No  ? Sexual activity: Yes  ?  Birth control/protection: None  ?Other Topics Concern  ? Not on file  ?Social History Narrative  ? Not on file  ? ?Social Determinants of Health  ? ?Financial Resource Strain: Not on file  ?Food Insecurity: Not on file  ?Transportation Needs: Not on file  ?Physical Activity: Not on file  ?Stress: Not on file  ?Social Connections: Not on file  ? ? ?Family History  ?Problem Relation Age of Onset  ? Healthy Mother   ? Healthy Father   ? Lupus Maternal Grandmother   ? Breast cancer Neg Hx   ? Ovarian cancer Neg Hx   ? Colon cancer Neg Hx   ? ? ?Medications Prior to Admission  ?Medication Sig Dispense Refill Last Dose  ? ferrous sulfate 325 (65 FE) MG tablet Take 325 mg by mouth daily.   Past Week  ? Prenatal Vit-Fe Fumarate-FA (PRENATAL VITAMINS PO) Take by mouth.   01/04/2022  ? ? ?No Known Allergies ? ?Review of Systems: Negative except  for what is mentioned in HPI. ? ?Physical Exam: ?BP 126/76 (BP Location: Left Arm)   Pulse 86   Temp 98.7 ?F (37.1 ?C) (Oral)   Resp 16   Ht 5\' 5"  (1.651 m)   Wt 98 kg   LMP 04/06/2021 (Exact Date)   BMI 35.94 kg/m?  ?CONSTITUTIONAL: Well-developed, well-nourished female in no acute distress.  ?HENT:  Normocephalic, atraumatic, External right and left ear normal. Oropharynx is clear and moist ?EYES: Conjunctivae and EOM are normal. Pupils are equal, round, and reactive to light. No scleral icterus.  ?NECK: Normal range of motion, supple, no masses ?SKIN: Skin is warm and dry. No rash noted. Not diaphoretic. No erythema. No pallor. ?NEUROLOGIC: Alert and oriented to person, place, and time. Normal reflexes, muscle tone coordination. No cranial nerve deficit noted. ?PSYCHIATRIC: Normal mood and affect. Normal behavior. Normal judgment and thought  content. ?CARDIOVASCULAR: Normal heart rate noted, regular rhythm ?RESPIRATORY: Effort and breath sounds normal, no problems with respiration noted ?ABDOMEN: Soft, nontender, nondistended, gravid. ?MUSCULOSKELETAL: Normal range of motion. No edema and no tenderness. 2+ distal pulses. ? ?Cervical Exam: Dilatation 2 cm   Effacement 30-40%   Station -3   ?Presentation: cephalic ?FHT:  Baseline rate 140 bpm   Variability minimal to moderate  Accelerations present   Decelerations none ?Contractions: Every 2-3 mins ?  ?Pertinent Labs/Studies:   ?Results for orders placed or performed during the hospital encounter of 01/04/22 (from the past 24 hour(s))  ?CBC     Status: Abnormal  ? Collection Time: 01/04/22  1:23 AM  ?Result Value Ref Range  ? WBC 9.3 4.0 - 10.5 K/uL  ? RBC 3.45 (L) 3.87 - 5.11 MIL/uL  ? Hemoglobin 9.6 (L) 12.0 - 15.0 g/dL  ? HCT 28.7 (L) 36.0 - 46.0 %  ? MCV 83.2 80.0 - 100.0 fL  ? MCH 27.8 26.0 - 34.0 pg  ? MCHC 33.4 30.0 - 36.0 g/dL  ? RDW 16.7 (H) 11.5 - 15.5 %  ? Platelets 228 150 - 400 K/uL  ? nRBC 0.0 0.0 - 0.2 %  ?Type and screen     Status: None  ? Collection Time: 01/04/22  1:23 AM  ?Result Value Ref Range  ? ABO/RH(D) O POS   ? Antibody Screen NEG   ? Sample Expiration    ?  01/07/2022,2359 ?Performed at [facility], [address], [place], [place] ?  ? ? ? ?Imaging:  ?[id number] OB Follow Up ?Patient Name: SHALLON YAKLIN ?DOB: [birth year] ?MRN: 1995 ? ?ULTRASOUND REPORT ? ?Location: [facility] ?Date of Service: 11/30/2021  ? ?Indications:growth/afi; history preterm labor/still born  ? ?Findings:  ?Singleton intrauterine pregnancy is visualized with FHR at 144 BPM.  ? ?Biometrics give an (U/S) Gestational age of [redacted]w[redacted]d and an (U/S) EDD of  ?01/12/22; this correlates with the clinically established Estimated Date of  ?Delivery: 01/11/22.  ? ?Fetal presentation is Cephalic.  ?Placenta: fundal. Grade: 2 ?AFI: 12.1 cm ? ?Growth percentile is 53.  AC percentile is 92. ?EFW:  66g / 5lb8oz. ? ?Impression: ?1. [redacted]w[redacted]d Viable Singleton Intrauterine pregnancy previously established  ?criteria. ?2. Growth is 53 %ile.  AFI is 12.1 cm.  ? ?Recommendations: ?1.Clinical correlation with the patient's History and Physical Exam. ? ?[redacted]w[redacted]d  [place]  ? ?The ultrasound images and findings were reviewed by me and I agree with  ?the above report. ? ?Sheralyn Boatman, M.D. ?12/05/2021 ?11:28 AM ? ? ?Assessment : ?TYISHA CRESSY is a 26  y.o. G3P2001 at [redacted]w[redacted]d being admitted for induction of labor due to history of prior term loss. ? ?Plan: ?Labor: Induction/Augmentation as ordered as per protocol. Has received second dose of Cytotec. Analgesia as needed. ?FWB: Reassuring fetal heart tracing.  GBS negative ?Delivery plan: Hopeful for vaginal delivery ? ? ? ?Hildred Laser, MD ?[facility] ? ?  ?

## 2022-01-04 NOTE — Anesthesia Preprocedure Evaluation (Addendum)
Anesthesia Evaluation  ?Patient identified by MRN, date of birth, ID band ?Patient awake ? ? ? ?Reviewed: ?Allergy & Precautions, H&P , NPO status , Patient's Chart, lab work & pertinent test results ? ?Airway ?Mallampati: II ? ?TM Distance: >3 FB ?Neck ROM: full ? ? ? Dental ? ?(+) Dental Advidsory Given ?  ?Pulmonary ?neg pulmonary ROS,  ?  ? ? ? ? ? ? ? Cardiovascular ?negative cardio ROS ? ? ? ? ?  ?Neuro/Psych ?negative neurological ROS ? negative psych ROS  ? GI/Hepatic ?Neg liver ROS, GERD  ,  ?Endo/Other  ?negative endocrine ROS ? Renal/GU ?negative Renal ROS  ?negative genitourinary ?  ?Musculoskeletal ?negative musculoskeletal ROS ?(+)  ? Abdominal ?  ?Peds ?negative pediatric ROS ?(+)  Hematology ? ?(+) Blood dyscrasia, anemia ,   ?Anesthesia Other Findings ? ? Reproductive/Obstetrics ?(+) Pregnancy ? ?  ? ? ? ? ? ? ? ? ? ? ? ? ? ?  ?  ? ? ? ? ? ? ? ?Anesthesia Physical ?Anesthesia Plan ? ?ASA: 2 ? ?Anesthesia Plan: Epidural  ? ?Post-op Pain Management:   ? ?Induction:  ? ?PONV Risk Score and Plan:  ? ?Airway Management Planned:  ? ?Additional Equipment:  ? ?Intra-op Plan:  ? ?Post-operative Plan:  ? ?Informed Consent:  ? ?Plan Discussed with: CRNA ? ?Anesthesia Plan Comments:   ? ? ? ? ? ? ?Anesthesia Quick Evaluation ? ?

## 2022-01-04 NOTE — Anesthesia Procedure Notes (Signed)
Epidural ?Patient location during procedure: OB ? ?Staffing ?Anesthesiologist: Foye Deer, MD ?Resident/CRNA: Mathews Argyle, CRNA ?Performed: resident/CRNA  ? ?Preanesthetic Checklist ?Completed: patient identified, IV checked, site marked, risks and benefits discussed, surgical consent, monitors and equipment checked, pre-op evaluation and timeout performed ? ?Epidural ?Patient position: sitting ?Prep: ChloraPrep and site prepped and draped ?Patient monitoring: heart rate, continuous pulse ox and blood pressure ?Approach: midline ?Location: L4-L5 ?Injection technique: LOR saline ? ?Needle:  ?Needle type: Tuohy  ?Needle gauge: 17 G ?Needle length: 9 cm and 9 ?Needle insertion depth: 8 cm ?Catheter type: closed end flexible ?Catheter size: 19 Gauge ?Catheter at skin depth: 13 cm ?Test dose: negative and 1.5% lidocaine with Epi 1:200 K ? ?Assessment ?Events: blood not aspirated, injection not painful, no injection resistance, no paresthesia and negative IV test ? ?Additional Notes ? ? ?Patient tolerated the insertion well without complications.Reason for block:procedure for pain ? ? ? ?

## 2022-01-05 LAB — CBC
HCT: 28.8 % — ABNORMAL LOW (ref 36.0–46.0)
Hemoglobin: 9.3 g/dL — ABNORMAL LOW (ref 12.0–15.0)
MCH: 27.4 pg (ref 26.0–34.0)
MCHC: 32.3 g/dL (ref 30.0–36.0)
MCV: 84.7 fL (ref 80.0–100.0)
Platelets: 193 10*3/uL (ref 150–400)
RBC: 3.4 MIL/uL — ABNORMAL LOW (ref 3.87–5.11)
RDW: 16.5 % — ABNORMAL HIGH (ref 11.5–15.5)
WBC: 10.6 10*3/uL — ABNORMAL HIGH (ref 4.0–10.5)
nRBC: 0.2 % (ref 0.0–0.2)

## 2022-01-05 NOTE — Lactation Note (Signed)
This note was copied from a baby's chart. ?Lactation Consultation Note ? ?Patient Name: Natalie Pierce ?Today's Date: 01/05/2022 ?  ?Age:27 hours ? ?Parent states pumping is going well but hasn't expressed any milk yet. She has been pumping every 3hrs for b/c of cramping. Informed pt that cramping is normal. Educated parent on gentle massage and hand expression. Parent did not have any additional question or concerns. ? ?Plan: ?Continue to pump every 3hrs, for the full duration of the cycle. ?Call lactation if there are any questions.  ? ?Maternal Data ?Has patient been taught Hand Expression?: Yes ?Does the patient have breastfeeding experience prior to this delivery?: Yes ?How long did the patient breastfeed?: 9mo ? ?Feeding ?Nipple Type: Slow - flow ? ? ?Lactation Tools Discussed/Used ?Tools: Pump ?Breast pump type: Double-Electric Breast Pump ? ?  ? ?Consult Status ? Follow up PRN ? ? ? ?Lawanda Cousins ?01/05/2022, 9:39 AM ? ? ? ?

## 2022-01-05 NOTE — Anesthesia Postprocedure Evaluation (Signed)
Anesthesia Post Note ? ?Patient: Natalie Pierce ? ?Procedure(s) Performed: AN AD HOC LABOR EPIDURAL ? ?Patient location during evaluation: Mother Baby ?Anesthesia Type: Epidural ?Level of consciousness: oriented and awake and alert ?Pain management: pain level controlled ?Vital Signs Assessment: post-procedure vital signs reviewed and stable ?Respiratory status: spontaneous breathing and respiratory function stable ?Cardiovascular status: blood pressure returned to baseline and stable ?Postop Assessment: no headache, no backache, no apparent nausea or vomiting and able to ambulate ?Anesthetic complications: no ? ? ?No notable events documented. ? ? ?Last Vitals:  ?Vitals:  ? 01/05/22 0038 01/05/22 0424  ?BP: 119/78 112/76  ?Pulse: 88 80  ?Resp: 18 18  ?Temp: 36.8 ?C 36.7 ?C  ?SpO2: 98% 97%  ?  ?Last Pain:  ?Vitals:  ? 01/05/22 0424  ?TempSrc: Oral  ?PainSc:   ? ? ?  ?  ?  ?  ?  ?  ? ?Stormy Fabian A ? ? ? ? ?

## 2022-01-05 NOTE — Progress Notes (Signed)
[place] Day # 1, s/p SVD ? ?Subjective: ?no complaints, up ad lib, voiding, and tolerating PO ? ?Objective: ?Temp:  [98 ?F (36.7 ?C)-98.5 ?F (36.9 ?C)] 98 ?F (36.7 ?C) (03/23 0424) ?Pulse Rate:  [79-125] 80 (03/23 0424) ?Resp:  [16-18] 18 (03/23 0424) ?BP: (108-135)/(73-90) 112/76 (03/23 0424) ?SpO2:  [97 %-100 %] 97 % (03/23 0424) ? ?Physical Exam:  ?General: alert and no distress  ?Lungs: clear to auscultation bilaterally ?Breasts: normal appearance, no masses or tenderness ?Heart: regular rate and rhythm, S1, S2 normal, no murmur, click, rub or gallop ?Abdomen: soft, non-tender; bowel sounds normal; no masses,  no organomegaly ?Pelvis: Lochia: appropriate, Uterine Fundus: firm ?Extremities: DVT Evaluation: No evidence of DVT seen on physical exam. No cords or calf tenderness. No significant calf/ankle edema. ? ?Recent Labs  ?  01/04/22 ?0123 01/05/22 ?3570  ?HGB 9.6* 9.3*  ?HCT 28.7* 28.8*  ? ? ?Assessment/Plan: ?Doing well postpartum ?Breastfeeding and formula feeding, Lactation consult as needed ?Circumcision prior to discharge ?Contraception NuvaRing ? ? LOS: 1 day  ? ?Hildred Laser, MD ?[facility] ?01/05/2022 8:31 AM ? ? ? ?

## 2022-01-06 MED ORDER — IBUPROFEN 600 MG PO TABS: 600.0000 mg | ORAL_TABLET | Freq: Four times a day (QID) | ORAL | 0 refills | Status: DC

## 2022-01-06 NOTE — Plan of Care (Signed)
Patient appropriate for discharge.

## 2022-01-06 NOTE — Lactation Note (Signed)
This note was copied from a baby's chart. ?Lactation Consultation Note ? ?Patient Name: Natalie Pierce ?Today's Date: 01/06/2022 ?Reason for consult: Follow-up assessment;NICU baby;Term;Hyperbilirubinemia ?Age:27 hours ?Mom For d/c this pm ?Maternal Data ?Does the patient have breastfeeding experience prior to this delivery?: Yes ?How long did the patient breastfeed?: 7 mths ?Mom is pumping every 3hrs, sometimes she gets a few drops, sometimes she sees dried colostrum on her nipples, I encouraged her to continue to pump every 3 hrs, that she would sometimes see more drops of colostrum than others, but would probably see more results tomorrow.  She had no milk supply problems with first child who is now 29 yrs old.      ?Feeding ?Mother's Current Feeding Choice: Breast Milk ?Nipple Type: Slow - flow ? ?LATCH Score ?  ? ?  ? ?  ? ?  ? ?  ? ?  ? ? ?Lactation Tools Discussed/Used ? LC name updated on white  board  ? ?Interventions ?Interventions: DEBP;Coconut oil;Education ?Also given medela purelan along with coconut oil to use with pumping in flanges to decrease potential for sore nipples ?Discharge ?Pump: DEBP;Personal ?WIC Program: No ? ?Consult Status ?Consult Status: PRN ? ? ? ?Dyann Kief ?01/06/2022, 12:19 PM ? ? ? ?

## 2022-01-06 NOTE — Discharge Summary (Signed)
? ?  Postpartum Discharge Summary ? ? ?   ?Patient Name: Natalie Pierce ?DOB: 02-08-1996 ?MRN: [id number] ? ?Date of admission: 01/04/2022 ?Delivery date:01/04/2022  ?Delivering provider: Rubie Maid  ?Date of discharge: 01/06/2022 ? ?Admitting diagnosis: History of IUFD [Z87.59] ?Pregnancy [Z34.90] ?Intrauterine pregnancy: [redacted]w[redacted]d    ?Secondary diagnosis:  Principal Problem: ?  History of IUFD ?Active Problems: ?  Pregnancy ? ?Additional problems: None    ?Discharge diagnosis: Term Pregnancy Delivered                                              ?Post partum procedures: None ?Augmentation: Cytotec ?Complications: None ? ?Hospital course: Induction of Labor With Vaginal Delivery   ?26y.o. yo G3P2001 at 346w3das admitted to the hospital 01/04/2022 for induction of labor.  Indication for induction: Elective due to history of term IUFD.  Patient had an uncomplicated labor course as follows: ?Membrane Rupture Time/Date: 1:05 PM ,01/04/2022   ?Delivery Method:Vaginal, Spontaneous  ?Episiotomy: None  ?Lacerations:  1st degree;Perineal  ?Details of delivery can be found in separate delivery note.  Patient had a routine postpartum course. Patient is discharged home 01/07/22. ? ?Newborn Data: ?Birth date:01/04/2022  ?Birth time:1:24 PM  ?Gender:Female  ?Living status:Living  ?Apgars:9 ,9  ?Weight:8 lb 1.1 oz (3.66 kg)  ? ?Magnesium Sulfate received: No ?BMZ received: No ?Rhophylac:No ?MMR:No ?T-DaP:Given prenatally ?Flu: No ?Transfusion:No ? ?Physical exam  ?Vitals:  ? 01/05/22 1203 01/05/22 1701 01/06/22 0105 01/06/22 0733  ?BP: 116/76 127/90 130/86 117/78  ?Pulse: 87 85 75 79  ?Resp: '18 18  18  ' ?Temp: 98.3 ?F (36.8 ?C) 98.5 ?F (36.9 ?C) (!) 97.5 ?F (36.4 ?C) 98.5 ?F (36.9 ?C)  ?TempSrc: Oral  Oral Oral  ?SpO2: 99% 98% 98% 100%  ?Weight:      ?Height:      ? ?General: alert, cooperative, and no distress ?Lochia: appropriate ?Uterine Fundus: firm ?Incision: N/A ?DVT Evaluation: No evidence of DVT seen on physical exam. ?Negative  Homan's sign. ?No cords or calf tenderness. ?No significant calf/ankle edema. ?Labs: ?Lab Results  ?Component Value Date  ? WBC 10.6 (H) 01/05/2022  ? HGB 9.3 (L) 01/05/2022  ? HCT 28.8 (L) 01/05/2022  ? MCV 84.7 01/05/2022  ? PLT 193 01/05/2022  ? ? ?Edinburgh Score ? ?  01/05/2022  ?  5:00 PM  ?Edinburgh Postnatal Depression Scale Screening Tool  ?I have been able to laugh and see the funny side of things. 0  ?I have looked forward with enjoyment to things. 0  ?I have blamed myself unnecessarily when things went wrong. 0  ?I have been anxious or worried for no good reason. 0  ?I have felt scared or panicky for no good reason. 0  ?Things have been getting on top of me. 0  ?I have been so unhappy that I have had difficulty sleeping. 0  ?I have felt sad or miserable. 0  ?I have been so unhappy that I have been crying. 0  ?The thought of harming myself has occurred to me. 0  ?Edinburgh Postnatal Depression Scale Total 0  ? ? ? ? ?After visit meds:  ?Allergies as of 01/06/2022   ?No Known Allergies ?  ? ?  ?Medication List  ?  ? ?TAKE these medications   ? ?ferrous sulfate 325 (65 FE) MG tablet ?Take 325 mg by mouth  daily. ?  ?ibuprofen 600 MG tablet ?Commonly known as: ADVIL ?Take 1 tablet (600 mg total) by mouth every 6 (six) hours. ?  ?PRENATAL VITAMINS PO ?Take by mouth. ?  ? ?  ? ? ? ?Discharge home in stable condition ?Infant Feeding: Bottle and Breast ?Infant Disposition:NICU ?Discharge instruction: per After Visit Summary and Postpartum booklet. ?Activity: Advance as tolerated. Pelvic rest for 6 weeks.  ?Diet: routine diet ?Anticipated Birth Control:  NuvaRing ?Postpartum Appointment:6 weeks ?Additional Postpartum F/U: Postpartum Depression checkup in 2 weeks ?Future Appointments:No future appointments. ?Follow up Visit: ? Follow-up Information   ? ? Rubie Maid, MD Follow up.   ?Specialties: Obstetrics and Gynecology, Radiology ?Why: 2 weeks postpartum mood check (televisit) ?6 weeks postpartum visit ?Contact  information: ?[address] ?[address] ?[place] [address] ?[phone] ? ? ?  ?  ? ?  ?  ? ?  ? ? ? ?  ? ?01/06/2022 ?Rubie Maid, MD ? ? ?

## 2022-01-06 NOTE — Progress Notes (Addendum)
Post Partum Day # 2, s/p SVD ? ?Subjective: ?no complaints, up ad lib, voiding, and tolerating PO ? ?Objective: ?Vitals:  ? 01/05/22 1203 01/05/22 1701 01/06/22 0105 01/06/22 0733  ?BP: 116/76 127/90 130/86 117/78  ?Pulse: 87 85 75 79  ?Resp: 18 18  18   ?Temp: 98.3 ?F (36.8 ?C) 98.5 ?F (36.9 ?C) (!) 97.5 ?F (36.4 ?C) 98.5 ?F (36.9 ?C)  ?TempSrc: Oral  Oral Oral  ?SpO2: 99% 98% 98% 100%  ?Weight:      ?Height:      ? ? ? ?Physical Exam:  ?General: alert and no distress  ?Lungs: clear to auscultation bilaterally ?Breasts: normal appearance, no masses or tenderness ?Heart: regular rate and rhythm, S1, S2 normal, no murmur, click, rub or gallop ?Abdomen: soft, non-tender; bowel sounds normal; no masses,  no organomegaly ?Pelvis: Lochia: appropriate, Uterine Fundus: firm ?Extremities: DVT Evaluation: No evidence of DVT seen on physical exam. No cords or calf tenderness. No significant calf/ankle edema. ? ?Recent Labs  ?  01/04/22 ?0123 01/05/22 ?01/07/22  ?HGB 9.6* 9.3*  ?HCT 28.7* 28.8*  ? ? ?Assessment/Plan: ?Doing well postpartum ?Breastfeeding and formula feeding, Lactation consult as needed ?Circumcision prior to discharge ?Contraception NuvaRing ?Discharge home today.  ? ? LOS: 2 days  ? ?[id number], MD ?[facility] ?01/06/2022 8:25 AM ? ? ? ?

## 2022-01-06 NOTE — Lactation Note (Signed)
This note was copied from a baby's chart. ?Lactation Consultation Note ? ?Patient Name: Natalie Pierce ?Today's Date: 01/06/2022 ?Reason for consult: Follow-up assessment;NICU baby ?Age:27 hours ? ?Maternal Data ?Has patient been taught Hand Expression?: Yes ?Does the patient have breastfeeding experience prior to this delivery?: Yes ?How long did the patient breastfeed?: 7 mths ? ?Feeding ?Mother's Current Feeding Choice: Breast Milk ?Nipple Type: Slow - flow ?First time for baby to feed at breast since yesterday, latched easily to right breast in cradle hold and nursed well with occ stimulation x 30 min, removed from breast and burped and then latched to left breast, latched easily but needs more stimulation to continue to nurse, baby alert  ?LATCH Score ?Latch: Grasps breast easily, tongue down, lips flanged, rhythmical sucking. ? ?Audible Swallowing: A few with stimulation ? ?Type of Nipple: Everted at rest and after stimulation ? ?Comfort (Breast/Nipple): Soft / non-tender ? ?Hold (Positioning): Assistance needed to correctly position infant at breast and maintain latch. ? ?LATCH Score: 8 ? ? ?Lactation Tools Discussed/Used ? MOm given bottles with labels to take home to use for pumping her breasts ? ?Interventions ?Interventions: Breast feeding basics reviewed;Assisted with latch;Hand express;Breast compression;Adjust position;Support pillows;Education ? ?Discharge ?Pump: DEBP;Personal ?WIC Program: No ? ?Consult Status ?Consult Status: PRN ? ? ? ?Dyann Kief ?01/06/2022, 3:20 PM ? ? ? ?

## 2022-01-09 ENCOUNTER — Emergency Department: Payer: BC Managed Care – PPO

## 2022-01-09 ENCOUNTER — Other Ambulatory Visit: Payer: Self-pay

## 2022-01-09 DIAGNOSIS — M7989 Other specified soft tissue disorders: Secondary | ICD-10-CM | POA: Diagnosis present

## 2022-01-09 DIAGNOSIS — R079 Chest pain, unspecified: Secondary | ICD-10-CM | POA: Diagnosis not present

## 2022-01-09 DIAGNOSIS — R519 Headache, unspecified: Secondary | ICD-10-CM | POA: Insufficient documentation

## 2022-01-09 DIAGNOSIS — I83893 Varicose veins of bilateral lower extremities with other complications: Secondary | ICD-10-CM | POA: Diagnosis not present

## 2022-01-09 LAB — CBC
HCT: 34.4 % — ABNORMAL LOW (ref 36.0–46.0)
Hemoglobin: 11 g/dL — ABNORMAL LOW (ref 12.0–15.0)
MCH: 27.8 pg (ref 26.0–34.0)
MCHC: 32 g/dL (ref 30.0–36.0)
MCV: 86.9 fL (ref 80.0–100.0)
Platelets: 267 10*3/uL (ref 150–400)
RBC: 3.96 MIL/uL (ref 3.87–5.11)
RDW: 17 % — ABNORMAL HIGH (ref 11.5–15.5)
WBC: 6.9 10*3/uL (ref 4.0–10.5)
nRBC: 0.4 % — ABNORMAL HIGH (ref 0.0–0.2)

## 2022-01-09 LAB — URINALYSIS, COMPLETE (UACMP) WITH MICROSCOPIC
Bacteria, UA: NONE SEEN
Bilirubin Urine: NEGATIVE
Glucose, UA: NEGATIVE mg/dL
Ketones, ur: NEGATIVE mg/dL
Leukocytes,Ua: NEGATIVE
Nitrite: NEGATIVE
Protein, ur: NEGATIVE mg/dL
Specific Gravity, Urine: 1.005 (ref 1.005–1.030)
pH: 7 (ref 5.0–8.0)

## 2022-01-09 LAB — BASIC METABOLIC PANEL
Anion gap: 10 (ref 5–15)
BUN: 10 mg/dL (ref 6–20)
CO2: 24 mmol/L (ref 22–32)
Calcium: 8.9 mg/dL (ref 8.9–10.3)
Chloride: 106 mmol/L (ref 98–111)
Creatinine, Ser: 0.8 mg/dL (ref 0.44–1.00)
GFR, Estimated: >60 mL/min (ref >60)
Glucose, Bld: 94 mg/dL (ref 70–99)
Potassium: 3.7 mmol/L (ref 3.5–5.1)
Sodium: 140 mmol/L (ref 135–145)

## 2022-01-09 LAB — TROPONIN I (HIGH SENSITIVITY): Troponin I (High Sensitivity): 3 ng/L (ref <18)

## 2022-01-09 LAB — POC URINE PREG, ED: Preg Test, Ur: NEGATIVE

## 2022-01-09 NOTE — ED Triage Notes (Signed)
Pt presents via POV c/o mid sternal chest pressure and left leg swelling x1 hr. Denies SOB.  ?

## 2022-01-10 ENCOUNTER — Encounter: Payer: Self-pay | Admitting: Obstetrics and Gynecology

## 2022-01-10 ENCOUNTER — Emergency Department
Admission: EM | Admit: 2022-01-10 | Discharge: 2022-01-10 | Disposition: A | Discharge status: Home / Self Care | Payer: BC Managed Care – PPO | Attending: Emergency Medicine | Admitting: Emergency Medicine

## 2022-01-10 ENCOUNTER — Emergency Department: Payer: BC Managed Care – PPO

## 2022-01-10 ENCOUNTER — Ambulatory Visit (INDEPENDENT_AMBULATORY_CARE_PROVIDER_SITE_OTHER): Payer: BC Managed Care – PPO | Admitting: Obstetrics and Gynecology

## 2022-01-10 VITALS — BP 131/78 | HR 64 | Resp 16 | Ht 65.0 in | Wt 201.9 lb

## 2022-01-10 DIAGNOSIS — O165 Unspecified maternal hypertension, complicating the puerperium: Secondary | ICD-10-CM | POA: Diagnosis not present

## 2022-01-10 DIAGNOSIS — R079 Chest pain, unspecified: Secondary | ICD-10-CM

## 2022-01-10 DIAGNOSIS — I83893 Varicose veins of bilateral lower extremities with other complications: Secondary | ICD-10-CM | POA: Diagnosis not present

## 2022-01-10 DIAGNOSIS — R519 Headache, unspecified: Secondary | ICD-10-CM

## 2022-01-10 LAB — HEPATIC FUNCTION PANEL
ALT: 39 U/L (ref 0–44)
AST: 35 U/L (ref 15–41)
Albumin: 3 g/dL — ABNORMAL LOW (ref 3.5–5.0)
Alkaline Phosphatase: 99 U/L (ref 38–126)
Bilirubin, Direct: <0.1 mg/dL (ref 0.0–0.2)
Total Bilirubin: 0.5 mg/dL (ref 0.3–1.2)
Total Protein: 6.8 g/dL (ref 6.5–8.1)

## 2022-01-10 LAB — PROTEIN / CREATININE RATIO, URINE
Creatinine, Urine: 80 mg/dL
Total Protein, Urine: <6 mg/dL

## 2022-01-10 LAB — TROPONIN I (HIGH SENSITIVITY): Troponin I (High Sensitivity): 3 ng/L (ref <18)

## 2022-01-10 MED ORDER — NIFEDIPINE ER OSMOTIC RELEASE 30 MG PO TB24: 30.0000 mg | ORAL_TABLET | Freq: Every day | ORAL | 0 refills | Status: DC

## 2022-01-10 MED ORDER — NIFEDIPINE ER OSMOTIC RELEASE 30 MG PO TB24
30.0000 mg | ORAL_TABLET | Freq: Once | ORAL | Status: AC
  Administered 2022-01-10: 30 mg via ORAL
  Filled 2022-01-10: qty 1

## 2022-01-10 MED ORDER — FUROSEMIDE 20 MG PO TABS: 20.0000 mg | ORAL_TABLET | Freq: Every day | ORAL | 0 refills | Status: DC

## 2022-01-10 NOTE — ED Notes (Signed)
Pt given DC instructions at this time and verbalized understanding of instructions.  Pt is A&O x4 at this time in NAD.   ?

## 2022-01-10 NOTE — Patient Instructions (Signed)
Postpartum Hypertension ?Postpartum hypertension is high blood pressure that is higher than normal after childbirth. It usually starts within 1 to 2 days after delivery, but it can happen at any time for up to 6 weeks after delivery. For some women, medical treatment is required to prevent serious complications, such as seizures or stroke. ?What are the causes? ?The cause of this condition is not well understood. In some cases, the cause may not be known. Certain conditions may increase your risk. These include: ?Hypertension that existed before pregnancy (chronic hypertension). ?Hypertension that comes as a result of pregnancy (gestational hypertension). ?Hypertensive disorders during pregnancy or seizures in women who have high blood pressure during pregnancy. These conditions are called preeclampsia and eclampsia. ?A condition in which the liver, platelets, and red blood cells are damaged during pregnancy (HELLP syndrome). ?Obesity. ?Diabetes. ?What are the signs or symptoms? ?As with all types of hypertension, postpartum hypertension may not have any symptoms. Depending on how high your blood pressure is, you may experience: ?Headaches. These may be mild, moderate, or severe. They may also be steady, constant, or sudden in onset (thunderclap headache). ?Vision changes, such as blurry vision, flashing lights, or seeing spots. ?Nausea and vomiting. ?Pain in the upper right side of your abdomen. ?Shortness of breath. ?Difficulty breathing while lying down. ?A decrease in the amount of urine that you pass. ?How is this diagnosed? ?This condition may be diagnosed based on the results of a physical exam, blood pressure measurements, and blood and urine tests. ?You may also have other tests, such as a CT scan or an MRI, to check for other problems of postpartum hypertension. ?How is this treated? ?If blood pressure is high enough to require treatment, your options may include: ?Medicines to reduce blood pressure  (antihypertensives). Tell your health care provider if you are breastfeeding or if you plan to breastfeed. There are many antihypertensive medicines that are safe to take while breastfeeding. ?Treating medical conditions that are causing hypertension. ?Treating the complications of hypertension, such as seizures, stroke, or kidney problems. ?Your health care provider will also continue to monitor your blood pressure closely until it is within a safe range for you. ?Follow these instructions at home: ?Learn your goal blood pressure ?Two numbers make up your blood pressure. The first number is called systolic pressure. The second is called diastolic pressure. An example of a blood pressure reading is "120 over 80" (or 120/80). For most people, goal blood pressure is: ?First number: below 140. ?Second number: below 90. ?Your blood pressure is above normal even if only the top or bottom number is above normal. ?Know what to do before you take your blood pressure ?30 minutes before you check your blood pressure: ?Do not drink caffeine. ?Do not drink alcohol. ?Avoid food and drink. ?Do not smoke. ?Do not exercise. ?5 minutes before you check your blood pressure: ?Use the bathroom and urinate so that you have an empty bladder. ?Sit quietly in a dining room chair. Do not sit in a soft couch or an armchair. Do not talk. ?Know how to take your blood pressure ?To check your blood pressure, follow the instructions in the manual that came with your blood pressure monitor. If you have a digital blood pressure monitor, the instructions may be as follows: ?Sit up straight. ?Place your feet on the floor. Do not cross your ankles or legs. ?Rest your left arm at the level of your heart. You may rest it on a table, desk, or chair. ?Pull  up your shirt sleeve. ?Wrap the blood pressure cuff around the upper part of your left arm. The cuff should be 1 inch (2.5 cm) above your elbow. It is best to wrap the cuff around bare skin. ?Fit the  cuff snugly around your arm. You should be able to place only one finger between the cuff and your arm. ?Put the cord inside the groove of your elbow. ?Press the power button. ?Sit quietly while the cuff fills with air and loses air. ?Write down the numbers on the screen. These are your blood pressure readings. ?Wait 1-2 minutes and then repeat steps 1-10. ? ?Record your blood pressure readings ?Follow your health care provider's instructions on how to record your blood pressure readings. If you were asked to use this form, follow these instructions: ?Get one reading in the morning (a.m.) before you take any medicines. ?Get one reading in the evening (p.m.) before supper. ?Take at least 2 readings with each blood pressure check. This makes sure the results are correct. Wait 1-2 minutes between measurements. ?Write down the results in the spaces on this form. ?Date: _______________________ ?a.m. _____________________(1st reading) _____________________(2nd reading) ?p.m. _____________________(1st reading) _____________________(2nd reading) ?Date: _______________________ ?a.m. _____________________(1st reading) _____________________(2nd reading) ?p.m. _____________________(1st reading) _____________________(2nd reading) ?Date: _______________________ ?a.m. _____________________(1st reading) _____________________(2nd reading) ?p.m. _____________________(1st reading) _____________________(2nd reading) ?Date: _______________________ ?a.m. _____________________(1st reading) _____________________(2nd reading) ?p.m. _____________________(1st reading) _____________________(2nd reading) ?Date: _______________________ ?a.m. _____________________(1st reading) _____________________(2nd reading) ?p.m. _____________________(1st reading) _____________________(2nd reading) ?General instructions ?Take over-the-counter and prescription medicines only as told by your health care provider. ?Do not use any products that contain nicotine  or tobacco. These products include cigarettes, chewing tobacco, and vaping devices, such as e-cigarettes. If you need help quitting, ask your health care provider. ?Check your blood pressure as often as recommended by your health care provider. ?Return to your normal activities as told by your health care provider. Ask your health care provider what activities are safe for you. ?Keep all follow-up visits. This is important. ?Contact a health care provider if: ?You have new symptoms, such as: ?A headache that does not get better. ?Dizziness. ?Visual changes. ?Nausea and vomiting. ?Get help right away if: ?You develop difficulty breathing. ?You have chest pain. ?You faint. ?You have any symptoms of a stroke. "BE FAST" is an easy way to remember the main warning signs of a stroke: ?B - Balance. Signs are dizziness, sudden trouble walking, or loss of balance. ?E - Eyes. Signs are trouble seeing or a sudden change in vision. ?F - Face. Signs are sudden weakness or numbness of the face, or the face or eyelid drooping on one side. ?A - Arms. Signs are weakness or numbness in an arm. This happens suddenly and usually on one side of the body. ?S - Speech. Signs are sudden trouble speaking, slurred speech, or trouble understanding what people say. ?T - Time. Time to call emergency services. Write down what time symptoms started. ?You have other signs of a stroke, such as: ?A sudden, severe headache with no known cause. ?Nausea or vomiting. ?Seizure. ?These symptoms may represent a serious problem that is an emergency. Do not wait to see if the symptoms will go away. Get medical help right away. Call your local emergency services (911 in the [place] Do not drive yourself to the hospital. ?Summary ?Postpartum hypertension is high blood pressure that remains higher than normal after childbirth. ?For some women, medical treatment is required to prevent serious complications, such as seizures or stroke. ?Follow your health care  provider's instructions on how to record your blood pressure readings. ?Keep all follow-up visits. This is important. ?This information is not intended to replace advice given to you by your health care provider.

## 2022-01-10 NOTE — Progress Notes (Signed)
? ? ?GYNECOLOGY PROGRESS NOTE ? ?Subjective:  ? ? Patient ID: Natalie Pierce, female    DOB: 05/12/1996, 26 y.o.   MRN: [id number] ? ?HPI ? Patient is a 26 y.o. 273P2001 female who presents for concerns of elevated blood pressure and edema in her legs.  She is 6 days postpartum s/p SVD (induction at 39 weeks for h/o term IUFD).  Patient was seen in the ER earlier this morning due to complaints. At that time she also reported on and off headaches. PIH evaluation was negative, but blood pressures noted to be mildly elevated (140s/80s).  Was instructed to begin Procardia 30 XL 30 mg daily.  ? ?Additionally noting some puffiness of her arms.  ? ?The following portions of the patient's history were reviewed and updated as appropriate: allergies, current medications, past family history, past medical history, past social history, past surgical history, and problem list. ? ?Review of Systems ?Pertinent items noted in HPI and remainder of comprehensive ROS otherwise negative.  ? ?Objective:  ? Blood pressure 131/78, pulse 64, resp. rate 16, height 5\' 5"  (1.651 m), weight 201 lb 14.4 oz (91.6 kg), last menstrual period 04/06/2021, currently breastfeeding.  Body mass index is 33.6 kg/m?. ? ?General appearance: alert and no distress ?Extremities: extremities normal, atraumatic, no cyanosis. Trace edema bilaterally.  ?Neurologic: Grossly normal ? ? ? ?Labs:  ?Admission on 01/10/2022, Discharged on 01/10/2022  ?Component Date Value Ref Range Status  ? Sodium 01/09/2022 140  135 - 145 mmol/L Final  ? Potassium 01/09/2022 3.7  3.5 - 5.1 mmol/L Final  ? Chloride 01/09/2022 106  98 - 111 mmol/L Final  ? CO2 01/09/2022 24  22 - 32 mmol/L Final  ? Glucose, Bld 01/09/2022 94  70 - 99 mg/dL Final  ? Glucose reference range applies only to samples taken after fasting for at least 8 hours.  ? BUN 01/09/2022 10  6 - 20 mg/dL Final  ? Creatinine, Ser 01/09/2022 0.80  0.44 - 1.00 mg/dL Final  ? Calcium 40/98/119103/27/2023 8.9  8.9 - 10.3 mg/dL Final   ? GFR, Estimated 01/09/2022 >60  >60 mL/min Final  ? Comment: (NOTE) ?Calculated using the CKD-EPI Creatinine Equation (2021) ?  ? Anion gap 01/09/2022 10  5 - 15 Final  ? Performed at [facility], [address] [place], [place] [address]  ? WBC 01/09/2022 6.9  4.0 - 10.5 K/uL Final  ? RBC 01/09/2022 3.96  3.87 - 5.11 MIL/uL Final  ? Hemoglobin 01/09/2022 11.0 (L)  12.0 - 15.0 g/dL Final  ? HCT 56/21/308603/27/2023 34.4 (L)  36.0 - 46.0 % Final  ? MCV 01/09/2022 86.9  80.0 - 100.0 fL Final  ? MCH 01/09/2022 27.8  26.0 - 34.0 pg Final  ? MCHC 01/09/2022 32.0  30.0 - 36.0 g/dL Final  ? RDW 57/84/696203/27/2023 17.0 (H)  11.5 - 15.5 % Final  ? Platelets 01/09/2022 267  150 - 400 K/uL Final  ? nRBC 01/09/2022 0.4 (H)  0.0 - 0.2 % Final  ? Performed at [facility], [address] [place], [place] [address]  ? Troponin I (High Sensitivity) 01/09/2022 3  <18 ng/L Final  ? Comment: (NOTE) ?Elevated high sensitivity troponin I (hsTnI) values and significant  ?changes across serial measurements may suggest ACS but many other  ?chronic and acute conditions are known to elevate hsTnI results.  ?Refer to the "Links" section for chest pain algorithms and additional  ?guidance. ?Performed at [facility], [address]  [place], ?[place] [address] ?  ? Preg Test, Ur 01/09/2022 NEGATIVE  NEGATIVE Final  ? Comment:        ?THE SENSITIVITY OF THIS ?METHODOLOGY IS >24 mIU/mL ?  ? Color, Urine 01/09/2022 STRAW (A)  YELLOW Final  ? APPearance 01/09/2022 CLEAR (A)  CLEAR Final  ? Specific Gravity, Urine 01/09/2022 1.005  1.005 - 1.030 Final  ? pH 01/09/2022 7.0  5.0 - 8.0 Final  ? Glucose, UA 01/09/2022 NEGATIVE  NEGATIVE mg/dL Final  ? Hgb urine dipstick 01/09/2022 LARGE (A)  NEGATIVE Final  ? Bilirubin Urine 01/09/2022 NEGATIVE  NEGATIVE Final  ? Ketones, ur 01/09/2022 NEGATIVE  NEGATIVE mg/dL Final  ? Protein, ur 27/12/5007 NEGATIVE  NEGATIVE mg/dL Final  ? Nitrite 38/18/2993 NEGATIVE  NEGATIVE Final  ? Leukocytes,Ua  01/09/2022 NEGATIVE  NEGATIVE Final  ? RBC / HPF 01/09/2022 0-5  0 - 5 RBC/hpf Final  ? WBC, UA 01/09/2022 0-5  0 - 5 WBC/hpf Final  ? Bacteria, UA 01/09/2022 NONE SEEN  NONE SEEN Final  ? Squamous Epithelial / LPF 01/09/2022 0-5  0 - 5 Final  ? Performed at [facility], [address] [place], [place] [address]  ? Troponin I (High Sensitivity) 01/10/2022 3  <18 ng/L Final  ? Comment: (NOTE) ?Elevated high sensitivity troponin I (hsTnI) values and significant  ?changes across serial measurements may suggest ACS but many other  ?chronic and acute conditions are known to elevate hsTnI results.  ?Refer to the "Links" section for chest pain algorithms and additional  ?guidance. ?Performed at [facility], [address] [place], ?[place] [address] ?  ? Total Protein 01/09/2022 6.8  6.5 - 8.1 g/dL Final  ? Albumin 08/01/5101 3.0 (L)  3.5 - 5.0 g/dL Final  ? AST 58/52/7782 35  15 - 41 U/L Final  ? ALT 01/09/2022 39  0 - 44 U/L Final  ? Alkaline Phosphatase 01/09/2022 99  38 - 126 U/L Final  ? Total Bilirubin 01/09/2022 0.5  0.3 - 1.2 mg/dL Final  ? Bilirubin, Direct 01/09/2022 <0.1  0.0 - 0.2 mg/dL Final  ? Indirect Bilirubin 01/09/2022 NOT CALCULATED  0.3 - 0.9 mg/dL Final  ? Performed at [facility], [address] [place], [place] [address]  ? Creatinine, Urine 01/09/2022 80  mg/dL Final  ? Total Protein, Urine 01/09/2022 <6  mg/dL Final  ? NO NORMAL RANGE ESTABLISHED FOR THIS TEST  ? Protein Creatinine Ratio 01/09/2022         0.00 - 0.15 mg/mg[Cre] Final  ? Comment: RESULT BELOW REPORTABLE RANGE, ?UNABLE TO CALCULATE. ?Performed at [facility], [address], [place], [place] [address] ?  ? ? ?Imaging:  ?US Venous Img Lower Bilateral ?CLINICAL DATA:  Lower extremity pain and swelling left greater than ?right, initial encounter ? ?EXAM: ?BILATERAL LOWER EXTREMITY VENOUS DOPPLER ULTRASOUND ? ?TECHNIQUE: ?Gray-scale sonography with graded compression, as well as  color ?Doppler and duplex ultrasound were performed to evaluate the lower ?extremity deep venous systems from the level of the common femoral ?vein and including the common femoral, femoral, profunda femoral, ?popliteal and calf veins including the posterior tibial, peroneal ?and gastrocnemius veins when visible. The superficial great ?saphenous vein was also interrogated. Spectral Doppler was utilized ?to evaluate flow at rest and with distal augmentation maneuvers in ?the common femoral, femoral and popliteal veins. ? ?COMPARISON:  None. ? ?FINDINGS: ?RIGHT LOWER EXTREMITY ? ?Common Femoral Vein: No evidence of thrombus. Normal ?compressibility, respiratory phasicity and response to augmentation. ? ?  Saphenofemoral Junction: No evidence of thrombus. Normal ?compressibility and flow on color Doppler imaging. ? ?Profunda Femoral Vein: No evidence of thrombus. Normal ?compressibility and flow on color Doppler imaging. ? ?Femoral Vein: No evidence of thrombus. Normal compressibility, ?respiratory phasicity and response to augmentation. ? ?Popliteal Vein: No evidence of thrombus. Normal compressibility, ?respiratory phasicity and response to augmentation. ? ?Calf Veins: No evidence of thrombus. Normal compressibility and flow ?on color Doppler imaging. ? ?Superficial Great Saphenous Vein: No evidence of thrombus. Normal ?compressibility. ? ?Venous Reflux:  None. ? ?Other Findings:  None. ? ?LEFT LOWER EXTREMITY ? ?Common Femoral Vein: No evidence of thrombus. Normal ?compressibility, respiratory phasicity and response to augmentation. ? ?Saphenofemoral Junction: No evidence of thrombus. Normal ?compressibility and flow on color Doppler imaging. ? ?Profunda Femoral Vein: No evidence of thrombus. Normal ?compressibility and flow on color Doppler imaging. ? ?Femoral Vein: No evidence of thrombus. Normal compressibility, ?respiratory phasicity and response to augmentation. ? ?Popliteal Vein: No evidence of thrombus. Normal  compressibility, ?respiratory phasicity and response to augmentation. ? ?Calf Veins: No evidence of thrombus. Normal compressibility and flow ?on color Doppler imaging. ? ?Superficial Great Saphenous Vein: No ev

## 2022-01-10 NOTE — ED Notes (Signed)
US at bedside

## 2022-01-10 NOTE — Discharge Instructions (Addendum)
Make sure to call your OBs office first thing in the morning for an appointment.  Take the Procardia once a day.  Return to the ER if you have worsening headache, visual floaters, nausea, vomiting, abdominal pain, worsening leg swelling, chest pain, shortness of breath, or worsening blood pressure. ?

## 2022-01-10 NOTE — ED Provider Notes (Signed)
? ?[facility] ?Provider Note ? ? ? Event Date/Time  ? First MD Initiated Contact with Patient 01/10/22 0057   ?  (approximate) ? ? ?History  ? ?Chest Pain ? ? ?HPI ? ?Natalie Pierce is a 26 y.o. female  who presents for leg swelling and CP. Patient is post vaginal delivery day 6. Symptoms stared this evening. First with LLE swelling and tightness then RLE. Then she started having a mild generalized throbbing headache and intermittent sharp chest pain that she describes as lasting a few seconds at a time.  Patient denies any history of hypertension, no history of preeclampsia, no history of PE or DVT.   ?  ? ? ?Past Medical History:  ?Diagnosis Date  ? Anemia   ? ? ?Past Surgical History:  ?Procedure Laterality Date  ? WISDOM TOOTH EXTRACTION    ? ? ? ?Physical Exam  ? ?Triage Vital Signs: ?ED Triage Vitals  ?Enc Vitals Group  ?   BP 01/09/22 2159 (!) 142/98  ?   Pulse Rate 01/09/22 2159 86  ?   Resp 01/09/22 2159 20  ?   Temp 01/09/22 2159 98.2 ?F (36.8 ?C)  ?   Temp Source 01/09/22 2159 Oral  ?   SpO2 01/09/22 2159 96 %  ?   Weight 01/09/22 2205 190 lb (86.2 kg)  ?   Height 01/09/22 2205 5\' 5"  (1.651 m)  ?   Head Circumference --   ?   Peak Flow --   ?   Pain Score 01/09/22 2205 8  ?   Pain Loc --   ?   Pain Edu? --   ?   Excl. in GC? --   ? ? ?Most recent vital signs: ?Vitals:  ? 01/09/22 2159 01/10/22 0130  ?BP: (!) 142/98 (!) 145/107  ?Pulse: 86 81  ?Resp: 20 20  ?Temp: 98.2 ?F (36.8 ?C)   ?SpO2: 96% 100%  ? ? ? ?Constitutional: Alert and oriented. Well appearing and in no apparent distress. ?HEENT: ?     Head: Normocephalic and atraumatic.    ?     Eyes: Conjunctivae are normal. Sclera is non-icteric.  ?     Mouth/Throat: Mucous membranes are moist.  ?     Neck: Supple with no signs of meningismus. ?Cardiovascular: Regular rate and rhythm. No murmurs, gallops, or rubs. 2+ symmetrical distal pulses are present in all extremities.  ?Respiratory: Normal respiratory effort. Lungs are clear  to auscultation bilaterally.  ?Gastrointestinal: Soft, non tender, and non distended with positive bowel sounds. No rebound or guarding. ?Genitourinary: No CVA tenderness. ?Musculoskeletal: Mild swelling of bilateral lower extremities with no pitting edema ?Neurologic: Normal speech and language. Face is symmetric. Moving all extremities. No gross focal neurologic deficits are appreciated. ?Skin: Skin is warm, dry and intact. No rash noted. ?Psychiatric: Mood and affect are normal. Speech and behavior are normal. ? ?ED Results / Procedures / Treatments  ? ?Labs ?(all labs ordered are listed, but only abnormal results are displayed) ?Labs Reviewed  ?CBC - Abnormal; Notable for the following components:  ?    Result Value  ? Hemoglobin 11.0 (*)   ? HCT 34.4 (*)   ? RDW 17.0 (*)   ? nRBC 0.4 (*)   ? All other components within normal limits  ?URINALYSIS, COMPLETE (UACMP) WITH MICROSCOPIC - Abnormal; Notable for the following components:  ? Color, Urine STRAW (*)   ? APPearance CLEAR (*)   ? Hgb urine dipstick LARGE (*)   ?  All other components within normal limits  ?HEPATIC FUNCTION PANEL - Abnormal; Notable for the following components:  ? Albumin 3.0 (*)   ? All other components within normal limits  ?BASIC METABOLIC PANEL  ?PROTEIN / CREATININE RATIO, URINE  ?POC URINE PREG, ED  ?TROPONIN I (HIGH SENSITIVITY)  ?TROPONIN I (HIGH SENSITIVITY)  ? ? ? ?EKG ? ?ED ECG REPORT ?I, Nita Sickle, the attending physician, personally viewed and interpreted this ECG. ? ?Sinus rhythm with a rate of 91, normal intervals, normal axis, no ST elevations or depressions ? ?RADIOLOGY ?I, Nita Sickle, attending MD, have personally viewed and interpreted the images obtained during this visit as below: ? ?Chest x-ray with no acute finding ? ? ?___________________________________________________ ?Interpretation by Radiologist:  ?DG Chest 2 View ? ?Result Date: 01/09/2022 ?CLINICAL DATA:  Midsternal chest pressure, left leg edema  EXAM: CHEST - 2 VIEW COMPARISON:  None. FINDINGS: The heart size and mediastinal contours are within normal limits. Both lungs are clear. The visualized skeletal structures are unremarkable. IMPRESSION: No active cardiopulmonary disease. Electronically Signed   By: Sharlet Salina M.D.   On: 01/09/2022 22:32  ? ?US Venous Img Lower Bilateral ? ?Result Date: 01/10/2022 ?CLINICAL DATA:  Lower extremity pain and swelling left greater than right, initial encounter EXAM: BILATERAL LOWER EXTREMITY VENOUS DOPPLER ULTRASOUND TECHNIQUE: Gray-scale sonography with graded compression, as well as color Doppler and duplex ultrasound were performed to evaluate the lower extremity deep venous systems from the level of the common femoral vein and including the common femoral, femoral, profunda femoral, popliteal and calf veins including the posterior tibial, peroneal and gastrocnemius veins when visible. The superficial great saphenous vein was also interrogated. Spectral Doppler was utilized to evaluate flow at rest and with distal augmentation maneuvers in the common femoral, femoral and popliteal veins. COMPARISON:  None. FINDINGS: RIGHT LOWER EXTREMITY Common Femoral Vein: No evidence of thrombus. Normal compressibility, respiratory phasicity and response to augmentation. Saphenofemoral Junction: No evidence of thrombus. Normal compressibility and flow on color Doppler imaging. Profunda Femoral Vein: No evidence of thrombus. Normal compressibility and flow on color Doppler imaging. Femoral Vein: No evidence of thrombus. Normal compressibility, respiratory phasicity and response to augmentation. Popliteal Vein: No evidence of thrombus. Normal compressibility, respiratory phasicity and response to augmentation. Calf Veins: No evidence of thrombus. Normal compressibility and flow on color Doppler imaging. Superficial Great Saphenous Vein: No evidence of thrombus. Normal compressibility. Venous Reflux:  None. Other Findings:  None.  LEFT LOWER EXTREMITY Common Femoral Vein: No evidence of thrombus. Normal compressibility, respiratory phasicity and response to augmentation. Saphenofemoral Junction: No evidence of thrombus. Normal compressibility and flow on color Doppler imaging. Profunda Femoral Vein: No evidence of thrombus. Normal compressibility and flow on color Doppler imaging. Femoral Vein: No evidence of thrombus. Normal compressibility, respiratory phasicity and response to augmentation. Popliteal Vein: No evidence of thrombus. Normal compressibility, respiratory phasicity and response to augmentation. Calf Veins: No evidence of thrombus. Normal compressibility and flow on color Doppler imaging. Superficial Great Saphenous Vein: No evidence of thrombus. Normal compressibility. Venous Reflux:  None. Other Findings:  None. IMPRESSION: No evidence of deep venous thrombosis in either lower extremity. Electronically Signed   By: Alcide Clever M.D.   On: 01/10/2022 02:32   ? ? ? ?PROCEDURES: ? ?Critical Care performed: No ? ?Procedures ? ? ? ?IMPRESSION / MDM / ASSESSMENT AND PLAN / ED COURSE  ?I reviewed the triage vital signs and the nursing notes. ? ? 26 y.o. female  who is pst  vaginal delivery 6 days ago who presents with b/l leg swelling, intermittent sharp CP, and HA.  On exam she is hypertensive with a BP of 145/107, otherwise afebrile with normal work of breathing normal sats, normal neuro exam.  She does have mild swelling of bilateral lower extremities but no pitting edema.  The swelling is symmetrical. ? ?Ddx: Preeclampsia versus postdelivery cardiomyopathy versus nephrotic syndrome versus DVT/PE ? ? ?Plan: EKG, troponin, chest x-ray, venous Doppler, CBC, metabolic panel, LFTs, urine protein to creatinine ratio, urinalysis.  Patient placed on telemetry for close monitoring of neurological exam and vitals. Consult OB ? ? ?MEDICATIONS GIVEN IN ED: ?Medications  ?NIFEdipine (PROCARDIA-XL/NIFEDICAL-XL) 24 hr tablet 30 mg (has no  administration in time range)  ? ? ? ?ED COURSE: Vitals improving with no intervention. Current BP 126/86. Labs showing normal platelets, normal hgb, normal LFTs, no protein in urine. Doppler neg for DVT. EKG,

## 2022-01-12 ENCOUNTER — Other Ambulatory Visit: Payer: BC Managed Care – PPO

## 2022-01-12 ENCOUNTER — Emergency Department
Admission: EM | Admit: 2022-01-12 | Discharge: 2022-01-12 | Disposition: A | Discharge status: Home / Self Care | Payer: BC Managed Care – PPO | Attending: Emergency Medicine | Admitting: Emergency Medicine

## 2022-01-12 ENCOUNTER — Other Ambulatory Visit: Payer: Self-pay

## 2022-01-12 ENCOUNTER — Emergency Department: Payer: BC Managed Care – PPO

## 2022-01-12 ENCOUNTER — Ambulatory Visit (INDEPENDENT_AMBULATORY_CARE_PROVIDER_SITE_OTHER): Payer: BC Managed Care – PPO | Admitting: Obstetrics and Gynecology

## 2022-01-12 ENCOUNTER — Other Ambulatory Visit: Payer: Self-pay | Admitting: Obstetrics and Gynecology

## 2022-01-12 VITALS — BP 114/88 | HR 78

## 2022-01-12 DIAGNOSIS — O165 Unspecified maternal hypertension, complicating the puerperium: Secondary | ICD-10-CM

## 2022-01-12 DIAGNOSIS — R079 Chest pain, unspecified: Secondary | ICD-10-CM

## 2022-01-12 DIAGNOSIS — R0789 Other chest pain: Secondary | ICD-10-CM | POA: Insufficient documentation

## 2022-01-12 DIAGNOSIS — R609 Edema, unspecified: Secondary | ICD-10-CM | POA: Insufficient documentation

## 2022-01-12 DIAGNOSIS — Z0130 Encounter for examination of blood pressure without abnormal findings: Secondary | ICD-10-CM

## 2022-01-12 LAB — BASIC METABOLIC PANEL
Anion gap: 9 (ref 5–15)
BUN: 12 mg/dL (ref 6–20)
CO2: 24 mmol/L (ref 22–32)
Calcium: 8.8 mg/dL — ABNORMAL LOW (ref 8.9–10.3)
Chloride: 104 mmol/L (ref 98–111)
Creatinine, Ser: 0.67 mg/dL (ref 0.44–1.00)
GFR, Estimated: >60 mL/min (ref >60)
Glucose, Bld: 82 mg/dL (ref 70–99)
Potassium: 3.6 mmol/L (ref 3.5–5.1)
Sodium: 137 mmol/L (ref 135–145)

## 2022-01-12 LAB — CBC
HCT: 35.9 % — ABNORMAL LOW (ref 36.0–46.0)
Hemoglobin: 11.4 g/dL — ABNORMAL LOW (ref 12.0–15.0)
MCH: 27.2 pg (ref 26.0–34.0)
MCHC: 31.8 g/dL (ref 30.0–36.0)
MCV: 85.7 fL (ref 80.0–100.0)
Platelets: 284 10*3/uL (ref 150–400)
RBC: 4.19 MIL/uL (ref 3.87–5.11)
RDW: 16.6 % — ABNORMAL HIGH (ref 11.5–15.5)
WBC: 4.6 10*3/uL (ref 4.0–10.5)
nRBC: 0 % (ref 0.0–0.2)

## 2022-01-12 LAB — TROPONIN I (HIGH SENSITIVITY)
Troponin I (High Sensitivity): 3 ng/L (ref <18)
Troponin I (High Sensitivity): 3 ng/L (ref <18)

## 2022-01-12 LAB — BRAIN NATRIURETIC PEPTIDE: B Natriuretic Peptide: 8.4 pg/mL (ref 0.0–100.0)

## 2022-01-12 MED ORDER — IOHEXOL 350 MG/ML SOLN
75.0000 mL | Freq: Once | INTRAVENOUS | Status: AC | PRN
Start: 2022-01-12 — End: 2022-01-12
  Administered 2022-01-12: 75 mL via INTRAVENOUS

## 2022-01-12 NOTE — ED Notes (Signed)
X-ray at bedside

## 2022-01-12 NOTE — ED Triage Notes (Signed)
Pt c/o chest pain since yesterday , worse with exertion, SOB when laying down, post partum x1 week.  ?

## 2022-01-12 NOTE — Progress Notes (Signed)
Patient came in today for labs and blood pressure check. Patient reports that she has been having some chest pain, palpitations, visual disturbances and sob when she lays down. Her blood pressure today was good 114/88 with a heart rate of 78. Please advise. ?

## 2022-01-12 NOTE — Discharge Instructions (Signed)
Please seek medical attention for any high fevers, chest pain, shortness of breath, change in behavior, persistent vomiting, bloody stool or any other new or concerning symptoms.  

## 2022-01-12 NOTE — ED Provider Notes (Signed)
? ?[facility] ?Provider Note ? ? ? Event Date/Time  ? First MD Initiated Contact with Patient 01/12/22 1045   ?  (approximate) ? ? ?History  ? ?Chest Pain ? ? ?HPI ? ?Natalie Pierce is a 26 y.o. female  who, per ob/gyn progress note from 2 days ago who was being seen for elevated blood pressure and edema at then 6 days post partum and had been seen in an ER earlier in the day, who presents to the emergency department today because of concern for chest pain. Patient states that her symptoms are similar to what they were two days ago when she had similar symptoms. The patient denies any worsening of symptoms. States she is nervous something bad is happening.  ? ?  ? ? ?Physical Exam  ? ?Triage Vital Signs: ?ED Triage Vitals  ?Enc Vitals Group  ?   BP 01/12/22 1004 (!) 138/93  ?   Pulse Rate 01/12/22 1004 72  ?   Resp 01/12/22 1004 (!) 22  ?   Temp 01/12/22 1004 98.4 ?F (36.9 ?C)  ?   Temp Source 01/12/22 1004 Oral  ?   SpO2 01/12/22 1004 100 %  ?   Weight 01/12/22 0959 190 lb (86.2 kg)  ?   Height 01/12/22 0959 5\' 5"  (1.651 m)  ?   Head Circumference --   ?   Peak Flow --   ?   Pain Score 01/12/22 0959 6  ? ?Most recent vital signs: ?Vitals:  ? 01/12/22 1004  ?BP: (!) 138/93  ?Pulse: 72  ?Resp: (!) 22  ?Temp: 98.4 ?F (36.9 ?C)  ?SpO2: 100%  ? ? ?General: Awake, no distress. Tearful. ?CV:  Good peripheral perfusion. Regular rate and rhythm. ?Resp:  Normal effort. Clear to auscultation ?Abd:  No distention.  ? ? ? ?ED Results / Procedures / Treatments  ? ?Labs ?(all labs ordered are listed, but only abnormal results are displayed) ?Labs Reviewed  ?BASIC METABOLIC PANEL - Abnormal; Notable for the following components:  ?    Result Value  ? Calcium 8.8 (*)   ? All other components within normal limits  ?CBC - Abnormal; Notable for the following components:  ? Hemoglobin 11.4 (*)   ? HCT 35.9 (*)   ? RDW 16.6 (*)   ? All other components within normal limits  ?BRAIN NATRIURETIC PEPTIDE  ?TROPONIN  I (HIGH SENSITIVITY)  ?TROPONIN I (HIGH SENSITIVITY)  ? ? ? ?EKG ? ?INance Pear, attending physician, personally viewed and interpreted this EKG ? ?EKG Time: 1004 ?Rate: 78 ?Rhythm: sinus rhythm ?Axis: normal ?Intervals: qtc 412 ?QRS: narrow ?ST changes: no st elevation ?Impression: normal ekg ? ?RADIOLOGY ?I independently interpreted and visualized the cxr. My interpretation: No pneumonia. No pneumothorax.  ?Radiology interpretation:  ?IMPRESSION:  ?No evidence of acute cardiopulmonary disease.  ?   ? ?CT angio PE ?IMPRESSION:  ?1. No CT evidence for acute pulmonary embolus.  ?2. No acute findings in the chest.  ?   ? ? ? ?PROCEDURES: ? ?Critical Care performed: No ? ?Procedures ? ? ?MEDICATIONS ORDERED IN ED: ?Medications - No data to display ? ? ?IMPRESSION / MDM / ASSESSMENT AND PLAN / ED COURSE  ?I reviewed the triage vital signs and the nursing notes. ?             ?               ? ?Differential diagnosis includes, but is not limited  to, anxiety, PE, PNA, PTX. ? ?Patient presents to the emergency department today for continued symptoms including chest pain. Had work up performed two days ago including lower extremity US. Blood work today without concerning abnormalities. Did obtain CT angio PE given concern for possible blood clot. Fortunately no PE on CT scan. Did discuss result with patient. Given reassuring work up did feel it would be appropriate for patient to be discharged home. Encouraged follow up with her primary care.  ? ? ?FINAL CLINICAL IMPRESSION(S) / ED DIAGNOSES  ? ?Final diagnoses:  ?Nonspecific chest pain  ? ? ? ?Note:  This document was prepared using Dragon voice recognition software and may include unintentional dictation errors. ? ?  ?Nance Pear, MD ?01/12/22 1537 ? ?

## 2022-01-13 LAB — COMPREHENSIVE METABOLIC PANEL
ALT: 22 IU/L (ref 0–32)
AST: 18 IU/L (ref 0–40)
Albumin/Globulin Ratio: 1.3 (ref 1.2–2.2)
Albumin: 3.8 g/dL — ABNORMAL LOW (ref 3.9–5.0)
Alkaline Phosphatase: 113 IU/L (ref 44–121)
BUN/Creatinine Ratio: 15 (ref 9–23)
BUN: 11 mg/dL (ref 6–20)
Bilirubin Total: 0.4 mg/dL (ref 0.0–1.2)
CO2: 23 mmol/L (ref 20–29)
Calcium: 9 mg/dL (ref 8.7–10.2)
Chloride: 101 mmol/L (ref 96–106)
Creatinine, Ser: 0.72 mg/dL (ref 0.57–1.00)
Globulin, Total: 2.9 g/dL (ref 1.5–4.5)
Glucose: 75 mg/dL (ref 70–99)
Potassium: 3.9 mmol/L (ref 3.5–5.2)
Sodium: 138 mmol/L (ref 134–144)
Total Protein: 6.7 g/dL (ref 6.0–8.5)
eGFR: 118 mL/min/{1.73_m2} (ref >59)

## 2022-01-18 ENCOUNTER — Encounter: Payer: Self-pay | Admitting: Obstetrics and Gynecology

## 2022-01-18 ENCOUNTER — Telehealth (INDEPENDENT_AMBULATORY_CARE_PROVIDER_SITE_OTHER): Payer: BC Managed Care – PPO | Admitting: Obstetrics and Gynecology

## 2022-01-18 VITALS — Ht 65.0 in | Wt 190.0 lb

## 2022-01-18 DIAGNOSIS — O165 Unspecified maternal hypertension, complicating the puerperium: Secondary | ICD-10-CM

## 2022-01-18 DIAGNOSIS — Z1332 Encounter for screening for maternal depression: Secondary | ICD-10-CM

## 2022-01-18 DIAGNOSIS — Z8679 Personal history of other diseases of the circulatory system: Secondary | ICD-10-CM | POA: Insufficient documentation

## 2022-01-18 NOTE — Progress Notes (Signed)
? ?  Virtual Visit via Video Note ? ?I connected with Natalie Pierce on 01/18/22 at  4:15 PM EDT by a video enabled telemedicine application and verified that I am speaking with the correct person using two identifiers. ? ?Location: ?Patient: Home ?Provider: Office ?  ?I discussed the limitations of evaluation and management by telemedicine and the availability of in person appointments. The patient expressed understanding and agreed to proceed. ? ?  ?History of Present Illness: ?  ?Natalie Pierce is a 26 y.o. G52P2001 female who presents for a 2 week televisit for mood check. She is 2 weeks postpartum following a spontaneous vaginal  The delivery was at 58 gestational weeks, IOL for h/o prior term pregnancy loss.  Postpartum course was complicated postpartum HTN on PPD#4, initiated on Procardia 30 mg daily and given Lasix to reduce LE swelling.  Infant course was complicated by jaundice, remained in the NICU for ~ 1 week.  Recently came home 2 days ago. Since then has been doing well.  Baby is feeding by breast (pumping). Bleeding: has not stopped. Postpartum depression screening: negative.  EDPS score is 1.  ?  ?  ?The following portions of the patient's history were reviewed and updated as appropriate: allergies, current medications, past family history, past medical history, past social history, past surgical history, and problem list. ?  ?Observations/Objective: ?  ?Height 5\' 5"  (1.651 m), weight 190 lb (86.2 kg), last menstrual period 04/06/2021, currently breastfeeding. ?Gen App: NAD ?Psych: normal speech, affect. Good mood.  ?  ? ? ?  01/05/2022  ?  5:00 PM  ?Edinburgh Postnatal Depression Scale Screening Tool  ?I have been able to laugh and see the funny side of things. 0  ?I have looked forward with enjoyment to things. 0  ?I have blamed myself unnecessarily when things went wrong. 0  ?I have been anxious or worried for no good reason. 0  ?I have felt scared or panicky for no good reason. 0  ?Things have  been getting on top of me. 0  ?I have been so unhappy that I have had difficulty sleeping. 0  ?I have felt sad or miserable. 0  ?I have been so unhappy that I have been crying. 0  ?The thought of harming myself has occurred to me. 0  ?Edinburgh Postnatal Depression Scale Total 0  ?  ? ? ? ?  ?Assessment and Plan: ?  ?1. Encounter for screening for maternal depression ?- Screening negative today. Will rescreen at 6 week postpartum visit. Overall doing well.  ?  ?2. Postpartum state ?- Overall doing well. Continue routine postpartum home care.  ?  ?3. Lactating mother ?- Doing well with pumping.   ?  ?4. Postpartum hypertension ?- To continue Procardia until 6 weeks postpartum. Currently asymptomatic.  ? ? ?Follow Up Instructions: ? RTC in 4 weeks for final postpartum visit ?  ?I discussed the assessment and treatment plan with the patient. The patient was provided an opportunity to ask questions and all were answered. The patient agreed with the plan and demonstrated an understanding of the instructions. ?  ?The patient was advised to call back or seek an in-person evaluation if the symptoms worsen or if the condition fails to improve as anticipated. ?  ?I provided 5 minutes of non-face-to-face time during this encounter. ?  ?  ?Rubie Maid, MD ?[facility] ? ?

## 2022-01-20 LAB — FETAL NONSTRESS TEST

## 2022-02-15 ENCOUNTER — Ambulatory Visit (INDEPENDENT_AMBULATORY_CARE_PROVIDER_SITE_OTHER): Payer: BC Managed Care – PPO | Admitting: Obstetrics and Gynecology

## 2022-02-15 ENCOUNTER — Encounter: Payer: Self-pay | Admitting: Obstetrics and Gynecology

## 2022-02-15 DIAGNOSIS — O165 Unspecified maternal hypertension, complicating the puerperium: Secondary | ICD-10-CM | POA: Diagnosis not present

## 2022-02-15 DIAGNOSIS — Z30015 Encounter for initial prescription of vaginal ring hormonal contraceptive: Secondary | ICD-10-CM | POA: Diagnosis not present

## 2022-02-15 LAB — POCT URINE PREGNANCY: Preg Test, Ur: NEGATIVE

## 2022-02-15 MED ORDER — ETONOGESTREL-ETHINYL ESTRADIOL 0.12-0.015 MG/24HR VA RING: VAGINAL_RING | VAGINAL | 3 refills | Status: DC

## 2022-02-15 NOTE — Progress Notes (Signed)
? ?OBSTETRICS POSTPARTUM CLINIC PROGRESS NOTE ? ?Subjective:  ?  ? Natalie Pierce is a 26 y.o. G42P2001 female who presents for a postpartum visit. She is 6 weeks postpartum following a spontaneous vaginal delivery. I have fully reviewed the prenatal and intrapartum course. The delivery was at 39 gestational weeks.  Anesthesia: epidural. Postpartum course was complicated by postpartum hypertension, started on Procardia 30 mg daily. Baby's course has been going well. Baby is feeding by breast. Bleeding: patient has not not resumed menses.  Bowel function is abnormal: She is still using stool softeners for constipation . Bladder function is normal. Patient is sexually active (used condoms).  Contraception method desired is NuvaRing vaginal inserts. Postpartum depression screening: negative.  EDPS score is 0.  ? ? ?The following portions of the patient's history were reviewed and updated as appropriate: allergies, current medications, past family history, past medical history, past social history, past surgical history, and problem list. ? ?Review of Systems ?Pertinent items are noted in HPI.  ? ?Objective:  ? ? BP 129/80   Pulse (!) 57   Resp 16   Ht 5\' 5"  (1.651 m)   Wt 185 lb 12.8 oz (84.3 kg)   LMP 04/06/2021 (Exact Date)   BMI 30.92 kg/m?   ?General:  alert and no distress  ? Breasts:  inspection negative, no nipple discharge or bleeding, no masses or nodularity palpable  ?Lungs: clear to auscultation bilaterally  ?Heart:  regular rate and rhythm, S1, S2 normal, no murmur, click, rub or gallop  ?Abdomen: soft, non-tender; bowel sounds normal; no masses,  no organomegaly.    ? Vulva:  normal  ?Vagina: normal vagina, no discharge, exudate, lesion, or erythema  ?Cervix:  no cervical motion tenderness and no lesions  ?Corpus: normal size, contour, position, consistency, mobility, non-tender  ?Adnexa:  normal adnexa and no mass, fullness, tenderness  ?Rectal Exam: Not performed.  ?      ? ? ?  02/15/2022  ? 10:35  AM 01/18/2022  ?  4:20 PM 01/05/2022  ?  5:00 PM  ?Inocente Salles Postnatal Depression Scale Screening Tool  ?I have been able to laugh and see the funny side of things. 0 0 0  ?I have looked forward with enjoyment to things. 0 0 0  ?I have blamed myself unnecessarily when things went wrong. 0 0 0  ?I have been anxious or worried for no good reason. 0 1 0  ?I have felt scared or panicky for no good reason. 0 0 0  ?Things have been getting on top of me. 0 0 0  ?I have been so unhappy that I have had difficulty sleeping. 0 0 0  ?I have felt sad or miserable. 0 0 0  ?I have been so unhappy that I have been crying. 0 0 0  ?The thought of harming myself has occurred to me. 0 0 0  ?Edinburgh Postnatal Depression Scale Total 0 1 0  ?  ? ?Labs:  ?Lab Results  ?Component Value Date  ? HGB 11.4 (L) 01/12/2022  ? ? ? ?Assessment:  ? ?1. Postpartum care following vaginal delivery   ?2. Postpartum hypertension   ?3. Encounter for initial prescription of vaginal ring hormonal contraceptive   ?  ?Plan:  ? ?1. Contraception: NuvaRing vaginal inserts. UPT negative today. Advised on Sunday start. Encourage back up method for at least first 1-2 weeks of use.  ?2. Postpartum HTN, patient stopped Procardia several days ago. BPs overall wnl. No need to continue  medication at this time.   ?3. Breastfeeding going well.  ?4. Follow up in:  as needed.Will return to PCP for general care. Pap smear up to date.  ? ? ?Rubie Maid, MD ?[facility] ? ?

## 2022-02-15 NOTE — Patient Instructions (Signed)

## 2022-05-26 ENCOUNTER — Emergency Department: Payer: BC Managed Care – PPO

## 2022-05-26 ENCOUNTER — Other Ambulatory Visit: Payer: Self-pay

## 2022-05-26 ENCOUNTER — Ambulatory Visit
Admission: RE | Admit: 2022-05-26 | Discharge: 2022-05-26 | Disposition: A | Discharge status: Home / Self Care | Payer: BC Managed Care – PPO | Source: Ambulatory Visit

## 2022-05-26 ENCOUNTER — Emergency Department
Admission: EM | Admit: 2022-05-26 | Discharge: 2022-05-26 | Disposition: A | Discharge status: Home / Self Care | Payer: BC Managed Care – PPO | Attending: Emergency Medicine | Admitting: Emergency Medicine

## 2022-05-26 ENCOUNTER — Encounter: Payer: Self-pay | Admitting: Emergency Medicine

## 2022-05-26 VITALS — BP 135/90 | HR 84 | Temp 98.6°F | Resp 19 | Ht 65.0 in | Wt 185.0 lb

## 2022-05-26 DIAGNOSIS — R030 Elevated blood-pressure reading, without diagnosis of hypertension: Secondary | ICD-10-CM

## 2022-05-26 DIAGNOSIS — M79604 Pain in right leg: Secondary | ICD-10-CM | POA: Diagnosis not present

## 2022-05-26 DIAGNOSIS — R202 Paresthesia of skin: Secondary | ICD-10-CM

## 2022-05-26 DIAGNOSIS — R600 Localized edema: Secondary | ICD-10-CM

## 2022-05-26 NOTE — ED Provider Notes (Signed)
[facility]    CSN: [id number] Arrival date & time: 05/26/22  1348      History   Chief Complaint Chief Complaint  Patient presents with   Hypertension    Started to have swelling yesterday afternoon, swelling has gone down and legs, but my arm still feels weird. I am having tingling and pain in my right leg and arm. I checked my blood pressure at 7 AM and it was a 146/86 I took medicine at 2Am - Entered by patient    HPI Natalie Pierce is a 26 y.o. female.  Patient presents with concern for elevated blood pressure readings at home.  She noted swelling of her right lower leg and  tingling in her right upper arm yesterday; these have resolved.  She took her blood pressure at 0200 this morning; it was 148/90; she took a nifedipine that had previously been prescribed for postpartum hypertension; this was stopped in May because blood pressure was normal without medication.  Blood pressure at home this morning was 146/86.  Patient denies dizziness, weakness, chest pain, shortness of breath, or other symptoms.    The history is provided by the patient and medical records.    Past Medical History:  Diagnosis Date   Anemia     Patient Active Problem List   Diagnosis Date Noted   Postpartum hypertension 01/18/2022   History of IUFD 01/04/2022   Decreased fetus movements affecting management of mother in third trimester    Pregnancy 01/01/2022   History of pregnancy loss in prior pregnancy, currently pregnant in third trimester 12/30/2021   IUFD at 20 weeks or more of gestation 10/15/2018   Supervision of other normal pregnancy, antepartum 03/14/2018    Past Surgical History:  Procedure Laterality Date   WISDOM TOOTH EXTRACTION      OB History     Gravida  3   Para  2   Term  2   Preterm      AB      Living  1      SAB      IAB      Ectopic      Multiple  0   Live Births  1            Home Medications    Prior to Admission medications    Medication Sig Start Date End Date Taking? Authorizing Provider  NIFEdipine (PROCARDIA-XL/NIFEDICAL-XL) 30 MG 24 hr tablet Take 1 tablet (30 mg total) by mouth daily. 01/10/22 01/10/23 Yes Veronese, [place], MD  etonogestrel-ethinyl estradiol (NUVARING) 0.12-0.015 MG/24HR vaginal ring Insert vaginally and leave in place for 3 consecutive weeks, then remove for 1 week. 02/15/22   Hildred Laser, MD  ferrous sulfate 325 (65 FE) MG tablet Take 325 mg by mouth daily.    [provider]  furosemide (LASIX) 20 MG tablet Take 1 tablet (20 mg total) by mouth daily. 01/10/22   Hildred Laser, MD  ibuprofen (ADVIL) 600 MG tablet Take 1 tablet (600 mg total) by mouth every 6 (six) hours. 01/06/22   Hildred Laser, MD  Prenatal Vit-Fe Fumarate-FA (PRENATAL VITAMINS PO) Take by mouth.    [provider]    Family History Family History  Problem Relation Age of Onset   Healthy Mother    Healthy Father    Lupus Maternal Grandmother    Breast cancer Neg Hx    Ovarian cancer Neg Hx    Colon cancer Neg Hx  Social History Social History   Tobacco Use   Smoking status: Never   Smokeless tobacco: Never  Vaping Use   Vaping Use: Never used  Substance Use Topics   Alcohol use: Not Currently    Comment: Occ   Drug use: No     Allergies   Patient has no known allergies.   Review of Systems Review of Systems  Constitutional:  Negative for chills and fever.  Respiratory:  Negative for cough and shortness of breath.   Cardiovascular:  Negative for chest pain and palpitations.  Skin:  Negative for color change and rash.  Neurological:  Negative for dizziness, seizures, syncope, speech difficulty, weakness, light-headedness, numbness and headaches.  All other systems reviewed and are negative.    Physical Exam Triage Vital Signs ED Triage Vitals  Enc Vitals Group     BP      Pulse      Resp      Temp      Temp src      SpO2      Weight      Height      Head  Circumference      Peak Flow      Pain Score      Pain Loc      Pain Edu?      Excl. in GC?    No data found.  Updated Vital Signs BP (!) 135/90   Pulse 84   Temp 98.6 F (37 C) (Oral)   Resp 19   Ht 5\' 5"  (1.651 m)   Wt 185 lb (83.9 kg)   LMP 04/24/2022   SpO2 99%   BMI 30.79 kg/m   Visual Acuity Right Eye Distance:   Left Eye Distance:   Bilateral Distance:    Right Eye Near:   Left Eye Near:    Bilateral Near:     Physical Exam Vitals and nursing note reviewed.  Constitutional:      General: She is not in acute distress.    Appearance: She is well-developed. She is not ill-appearing.  HENT:     Mouth/Throat:     Mouth: Mucous membranes are moist.  Cardiovascular:     Rate and Rhythm: Normal rate and regular rhythm.     Heart sounds: Normal heart sounds.  Pulmonary:     Effort: Pulmonary effort is normal. No respiratory distress.     Breath sounds: Normal breath sounds.  Musculoskeletal:        General: No swelling or tenderness. Normal range of motion.     Cervical back: Neck supple.     Comments: Right calf approximately 37 cm, left calf approximately 36 cm.  Skin:    General: Skin is warm and dry.     Capillary Refill: Capillary refill takes less than 2 seconds.     Findings: No erythema or rash.  Neurological:     General: No focal deficit present.     Mental Status: She is alert and oriented to person, place, and time.     Sensory: No sensory deficit.     Motor: No weakness.     Gait: Gait normal.  Psychiatric:        Mood and Affect: Mood normal.        Behavior: Behavior normal.      UC Treatments / Results  Labs (all labs ordered are listed, but only abnormal results are displayed) Labs Reviewed - No data to display  EKG  Radiology No results found.  Procedures Procedures (including critical care time)  Medications Ordered in UC Medications - No data to display  Initial Impression / Assessment and Plan / UC Course  I have  reviewed the triage vital signs and the nursing notes.  Pertinent labs & imaging results that were available during my care of the patient were reviewed by me and considered in my medical decision making (see chart for details).    Elevated blood pressure reading, tingling sensation of right upper extremity, edema of right lower leg.  Discussed limitations of evaluation in urgent care setting; patient declines transfer to the ED.  Her blood pressure is 135/90 currently.  She is asymptomatic; She states the swelling of her right calf and tingling in her right upper arm have resolved today.  She has an appointment scheduled with her PCP next week.  ED precautions discussed.  Education provided on preventing hypertension.  Final Clinical Impressions(s) / UC Diagnoses   Final diagnoses:  Elevated blood pressure reading  Tingling of right upper extremity  Edema of right lower leg     Discharge Instructions      Your blood pressure today is 135/90.    Go to the emergency department if you have concerning symptoms such as swelling or tingling.   Follow-up as scheduled with your primary care provider next week.        ED Prescriptions   None    PDMP not reviewed this encounter.   Mickie Bail, NP 05/26/22 1440

## 2022-05-26 NOTE — ED Triage Notes (Signed)
Patient to Urgent Care with complaints of hypertension. Reports that she started experiencing swelling in right leg, and tingling in her bilateral feet and arms yesterday. Hx of gestational HTN in march while pregnant with son but this had resolved. Denies CP or SHOB.   Reports taking her bp at 2am this morning and it was 148/90, took BP meds (nicardipine) at 2:41am and repeat bp at 6:50am was 146/86

## 2022-05-26 NOTE — ED Triage Notes (Signed)
Pt in with co pain pain to right leg and right arm. States feels like tingling and burning. Pt states pain to right leg has been persistent and feels like she has some swelling.

## 2022-05-26 NOTE — ED Notes (Signed)
Pt left without receiving discharge papers. Unable to obtain VS upon discharge.

## 2022-05-26 NOTE — ED Provider Notes (Signed)
[facility] Provider Note   Event Date/Time   First MD Initiated Contact with Patient 05/26/22 1621     (approximate) History  Leg Pain  HPI Natalie Pierce is a 26 y.o. female with stated past medical history of gestational hypertension who presents for 2 days of intermittent upper extremity paresthesias and right lower extremity paresthesia as well as mild pain.  Patient states that the symptoms began after she slept in her car overnight for a concert.  Patient does state that holding her head in a certain direction will cause her pain to get worse.  Patient denies any other exacerbating or relieving factors ROS: Patient currently denies any vision changes, tinnitus, difficulty speaking, facial droop, sore throat, chest pain, shortness of breath, abdominal pain, nausea/vomiting/diarrhea, dysuria   Physical Exam  Triage Vital Signs: ED Triage Vitals [05/26/22 1544]  Enc Vitals Group     BP (!) 153/95     Pulse Rate 76     Resp 20     Temp 98.4 F (36.9 C)     Temp Source Oral     SpO2 99 %     Weight 185 lb (83.9 kg)     Height 5\' 5"  (1.651 m)     Head Circumference      Peak Flow      Pain Score 7     Pain Loc      Pain Edu?      Excl. in GC?    Most recent vital signs: Vitals:   05/26/22 1544  BP: (!) 153/95  Pulse: 76  Resp: 20  Temp: 98.4 F (36.9 C)  SpO2: 99%   General: Awake, oriented x4. CV:  Good peripheral perfusion.  Resp:  Normal effort.  Abd:  No distention.  Other:  Young adult African-American female laying in bed in no acute distress with very mild subjective decrease sensation over the lateral aspect of the right upper extremity down into the fourth and fifth digit as well as the lateral aspect of the right lower extremity.  Spurling test positive on the right ED Results / Procedures / Treatments  RADIOLOGY ED MD interpretation: Doppler ultrasound of the right lower extremity interpreted by me shows no evidence of DVT or  any other abnormalities -Agree with radiology assessment Official radiology report(s): 07/26/22 Venous Img Lower Unilateral Right  Result Date: 05/26/2022 CLINICAL DATA:  26 year old female with swelling and pain EXAM: RIGHT LOWER EXTREMITY VENOUS DOPPLER ULTRASOUND TECHNIQUE: Gray-scale sonography with graded compression, as well as color Doppler and duplex ultrasound were performed to evaluate the lower extremity deep venous systems from the level of the common femoral vein and including the common femoral, femoral, profunda femoral, popliteal and calf veins including the posterior tibial, peroneal and gastrocnemius veins when visible. The superficial great saphenous vein was also interrogated. Spectral Doppler was utilized to evaluate flow at rest and with distal augmentation maneuvers in the common femoral, femoral and popliteal veins. COMPARISON:  None Available. FINDINGS: Contralateral Common Femoral Vein: Respiratory phasicity is normal and symmetric with the symptomatic side. No evidence of thrombus. Normal compressibility. Common Femoral Vein: No evidence of thrombus. Normal compressibility, respiratory phasicity and response to augmentation. Saphenofemoral Junction: No evidence of thrombus. Normal compressibility and flow on color Doppler imaging. Profunda Femoral Vein: No evidence of thrombus. Normal compressibility and flow on color Doppler imaging. Femoral Vein: No evidence of thrombus. Normal compressibility, respiratory phasicity and response to augmentation. Popliteal Vein: No evidence of thrombus. Normal  compressibility, respiratory phasicity and response to augmentation. Calf Veins: No evidence of thrombus. Normal compressibility and flow on color Doppler imaging. Superficial Great Saphenous Vein: No evidence of thrombus. Normal compressibility and flow on color Doppler imaging. Other Findings:  None. IMPRESSION: Directed duplex right lower extremity negative for DVT Signed, Yvone Neu. Miachel Roux, RPVI [facility] Electronically Signed   By: Gilmer Mor D.O.   On: 05/26/2022 16:55   PROCEDURES: Critical Care performed: No Procedures MEDICATIONS ORDERED IN ED: Medications - No data to display IMPRESSION / MDM / ASSESSMENT AND PLAN / ED COURSE  I reviewed the triage vital signs and the nursing notes.                             The patient is on the cardiac monitor to evaluate for evidence of arrhythmia and/or significant heart rate changes. Patient's presentation is most consistent with acute presentation with potential threat to life or bodily function. Presents with sensation of tingling to right upper and lower extremity.  Patients symptoms and work-up not consistent with a stroke and therefore they will be discharged from the ED.  Patient has currently been stabilized in the emergency department.  Patient received a Doppler ultrasound of the right lower extremity that was negative for DVT  Patients symptoms not typical for other emergent causes such as dissection, infection, DKA, trauma. Patient will be discharged with strict return precautions and follow up with primary MD within 24 hours for further evaluation.   FINAL CLINICAL IMPRESSION(S) / ED DIAGNOSES   Final diagnoses:  Right leg pain  Arm paresthesia, right   Rx / DC Orders   ED Discharge Orders     None      Note:  This document was prepared using Dragon voice recognition software and may include unintentional dictation errors.   Merwyn Katos, MD 05/26/22 1725

## 2022-05-26 NOTE — Discharge Instructions (Addendum)
Your blood pressure today is 135/90.    Go to the emergency department if you have concerning symptoms such as swelling or tingling.   Follow-up as scheduled with your primary care provider next week.

## 2022-07-25 ENCOUNTER — Encounter: Payer: Self-pay | Admitting: Obstetrics and Gynecology

## 2022-08-21 ENCOUNTER — Ambulatory Visit (INDEPENDENT_AMBULATORY_CARE_PROVIDER_SITE_OTHER): Payer: BC Managed Care – PPO

## 2022-08-21 ENCOUNTER — Other Ambulatory Visit (HOSPITAL_COMMUNITY)
Admission: RE | Admit: 2022-08-21 | Discharge: 2022-08-21 | Disposition: A | Discharge status: Home / Self Care | Payer: BC Managed Care – PPO | Source: Ambulatory Visit | Attending: Obstetrics and Gynecology | Admitting: Obstetrics and Gynecology

## 2022-08-21 VITALS — BP 128/89 | HR 87 | Resp 16 | Ht 65.0 in | Wt 197.3 lb

## 2022-08-21 DIAGNOSIS — Z113 Encounter for screening for infections with a predominantly sexual mode of transmission: Secondary | ICD-10-CM | POA: Insufficient documentation

## 2022-08-21 DIAGNOSIS — Z0283 Encounter for blood-alcohol and blood-drug test: Secondary | ICD-10-CM

## 2022-08-21 DIAGNOSIS — N912 Amenorrhea, unspecified: Secondary | ICD-10-CM

## 2022-08-21 DIAGNOSIS — Z3201 Encounter for pregnancy test, result positive: Secondary | ICD-10-CM

## 2022-08-21 DIAGNOSIS — Z3A01 Less than 8 weeks gestation of pregnancy: Secondary | ICD-10-CM

## 2022-08-21 DIAGNOSIS — Z3481 Encounter for supervision of other normal pregnancy, first trimester: Secondary | ICD-10-CM

## 2022-08-21 LAB — POCT URINE PREGNANCY: Preg Test, Ur: POSITIVE — AB

## 2022-08-21 NOTE — Patient Instructions (Signed)
Commonly Asked Questions During Pregnancy  Cats: A parasite can be excreted in cat feces.  To avoid exposure you need to have another person empty the little box.  If you must empty the litter box you will need to wear gloves.  Wash your hands after handling your cat.  This parasite can also be found in raw or undercooked meat so this should also be avoided.  Colds, Sore Throats, Flu: Please check your medication sheet to see what you can take for symptoms.  If your symptoms are unrelieved by these medications please call the office.  Dental Work: Most any dental work your dentist recommends is permitted.  X-rays should only be taken during the first trimester if absolutely necessary.  Your abdomen should be shielded with a lead apron during all x-rays.  Please notify your provider prior to receiving any x-rays.  Novocaine is fine; gas is not recommended.  If your dentist requires a note from us prior to dental work please call the office and we will provide one for you.  Exercise: Exercise is an important part of staying healthy during your pregnancy.  You may continue most exercises you were accustomed to prior to pregnancy.  Later in your pregnancy you will most likely notice you have difficulty with activities requiring balance like riding a bicycle.  It is important that you listen to your body and avoid activities that put you at a higher risk of falling.  Adequate rest and staying well hydrated are a must!  If you have questions about the safety of specific activities ask your provider.    Exposure to Children with illness: Try to avoid obvious exposure; report any symptoms to us when noted,  If you have chicken pos, red measles or mumps, you should be immune to these diseases.   Please do not take any vaccines while pregnant unless you have checked with your OB provider.  Fetal Movement: After 28 weeks we recommend you do "kick counts" twice daily.  Lie or sit down in a calm quiet environment and  count your baby movements "kicks".  You should feel your baby at least 10 times per hour.  If you have not felt 10 kicks within the first hour get up, walk around and have something sweet to eat or drink then repeat for an additional hour.  If count remains less than 10 per hour notify your provider.  Fumigating: Follow your pest control agent's advice as to how long to stay out of your home.  Ventilate the area well before re-entering.  Hemorrhoids:   Most over-the-counter preparations can be used during pregnancy.  Check your medication to see what is safe to use.  It is important to use a stool softener or fiber in your diet and to drink lots of liquids.  If hemorrhoids seem to be getting worse please call the office.   Hot Tubs:  Hot tubs Jacuzzis and saunas are not recommended while pregnant.  These increase your internal body temperature and should be avoided.  Intercourse:  Sexual intercourse is safe during pregnancy as long as you are comfortable, unless otherwise advised by your provider.  Spotting may occur after intercourse; report any bright red bleeding that is heavier than spotting.  Labor:  If you know that you are in labor, please go to the hospital.  If you are unsure, please call the office and let us help you decide what to do.  Lifting, straining, etc:  If your job requires heavy   lifting or straining please check with your provider for any limitations.  Generally, you should not lift items heavier than that you can lift simply with your hands and arms (no back muscles)  Painting:  Paint fumes do not harm your pregnancy, but may make you ill and should be avoided if possible.  Latex or water based paints have less odor than oils.  Use adequate ventilation while painting.  Permanents & Hair Color:  Chemicals in hair dyes are not recommended as they cause increase hair dryness which can increase hair loss during pregnancy.  " Highlighting" and permanents are allowed.  Dye may be  absorbed differently and permanents may not hold as well during pregnancy.  Sunbathing:  Use a sunscreen, as skin burns easily during pregnancy.  Drink plenty of fluids; avoid over heating.  Tanning Beds:  Because their possible side effects are still unknown, tanning beds are not recommended.  Ultrasound Scans:  Routine ultrasounds are performed at approximately 20 weeks.  You will be able to see your baby's general anatomy an if you would like to know the gender this can usually be determined as well.  If it is questionable when you conceived you may also receive an ultrasound early in your pregnancy for dating purposes.  Otherwise ultrasound exams are not routinely performed unless there is a medical necessity.  Although you can request a scan we ask that you pay for it when conducted because insurance does not cover " patient request" scans.  Work: If your pregnancy proceeds without complications you may work until your due date, unless your physician or employer advises otherwise.  Round Ligament Pain/Pelvic Discomfort:  Sharp, shooting pains not associated with bleeding are fairly common, usually occurring in the second trimester of pregnancy.  They tend to be worse when standing up or when you remain standing for long periods of time.  These are the result of pressure of certain pelvic ligaments called "round ligaments".  Rest, Tylenol and heat seem to be the most effective relief.  As the womb and fetus grow, they rise out of the pelvis and the discomfort improves.  Please notify the office if your pain seems different than that described.  It may represent a more serious condition.  First Trimester of Pregnancy  The first trimester of pregnancy starts on the first day of your last menstrual period until the end of week 12. This is also called months 1 through 3 of pregnancy. Body changes during your first trimester Your body goes through many changes during pregnancy. The changes usually  return to normal after your baby is born. Physical changes You may gain or lose weight. Your breasts may grow larger and hurt. The area around your nipples may get darker. Dark spots or blotches may develop on your face. You may have changes in your hair. Health changes You may feel like you might vomit (nauseous), and you may vomit. You may have heartburn. You may have headaches. You may have trouble pooping (constipation). Your gums may bleed. Other changes You may get tired easily. You may pee (urinate) more often. Your menstrual periods will stop. You may not feel hungry. You may want to eat certain kinds of food. You may have changes in your emotions from day to day. You may have more dreams. Follow these instructions at home: Medicines Take over-the-counter and prescription medicines only as told by your doctor. Some medicines are not safe during pregnancy. Take a prenatal vitamin that contains at least 600  micrograms (mcg) of folic acid. Eating and drinking Eat healthy meals that include: Fresh fruits and vegetables. Whole grains. Good sources of protein, such as meat, eggs, or tofu. Low-fat dairy products. Avoid raw meat and unpasteurized juice, milk, and cheese. If you feel like you may vomit, or you vomit: Eat 4 or 5 small meals a day instead of 3 large meals. Try eating a few soda crackers. Drink liquids between meals instead of during meals. You may need to take these actions to prevent or treat trouble pooping: Drink enough fluids to keep your pee (urine) pale yellow. Eat foods that are high in fiber. These include beans, whole grains, and fresh fruits and vegetables. Limit foods that are high in fat and sugar. These include fried or sweet foods. Activity Exercise only as told by your doctor. Most people can do their usual exercise routine during pregnancy. Stop exercising if you have cramps or pain in your lower belly (abdomen) or low back. Do not exercise if  it is too hot or too humid, or if you are in a place of great height (high altitude). Avoid heavy lifting. If you choose to, you may have sex unless your doctor tells you not to. Relieving pain and discomfort Wear a good support bra if your breasts are sore. Rest with your legs raised (elevated) if you have leg cramps or low back pain. If you have bulging veins (varicose veins) in your legs: Wear support hose as told by your doctor. Raise your feet for 15 minutes, 3-4 times a day. Limit salt in your food. Safety Wear your seat belt at all times when you are in a car. Talk with your doctor if someone is hurting you or yelling at you. Talk with your doctor if you are feeling sad or have thoughts of hurting yourself. Lifestyle Do not use hot tubs, steam rooms, or saunas. Do not douche. Do not use tampons or scented sanitary pads. Do not use herbal medicines, illegal drugs, or medicines that are not approved by your doctor. Do not drink alcohol. Do not smoke or use any products that contain nicotine or tobacco. If you need help quitting, ask your doctor. Avoid cat litter boxes and soil that is used by cats. These carry germs that can cause harm to the baby and can cause a loss of your baby by miscarriage or stillbirth. General instructions Keep all follow-up visits. This is important. Ask for help if you need counseling or if you need help with nutrition. Your doctor can give you advice or tell you where to go for help. Visit your dentist. At home, brush your teeth with a soft toothbrush. Floss gently. Write down your questions. Take them to your prenatal visits. Where to find more information American Pregnancy Association: americanpregnancy.org [employer]: www.acog.org Office on Women's Health: KeywordPortfolios.com.br Contact a doctor if: You are dizzy. You have a fever. You have mild cramps or pressure in your lower belly. You have a nagging  pain in your belly area. You continue to feel like you may vomit, you vomit, or you have watery poop (diarrhea) for 24 hours or longer. You have a bad-smelling fluid coming from your vagina. You have pain when you pee. You are exposed to a disease that spreads from person to person, such as chickenpox, measles, Zika virus, HIV, or hepatitis. Get help right away if: You have spotting or bleeding from your vagina. You have very bad belly cramping or pain. You have  shortness of breath or chest pain. You have any kind of injury, such as from a fall or a car crash. You have new or increased pain, swelling, or redness in an arm or leg. Summary The first trimester of pregnancy starts on the first day of your last menstrual period until the end of week 12 (months 1 through 3). Eat 4 or 5 small meals a day instead of 3 large meals. Do not smoke or use any products that contain nicotine or tobacco. If you need help quitting, ask your doctor. Keep all follow-up visits. This information is not intended to replace advice given to you by your health care provider. Make sure you discuss any questions you have with your health care provider. Document Revised: 03/10/2020 Document Reviewed: 01/15/2020 Elsevier Patient Education  [place]. Morning Sickness  Morning sickness is when you feel like you may vomit (feel nauseous) during pregnancy. Sometimes, you may vomit. Morning sickness most often happens in the morning, but it can also happen at any time of the day. Some women may have morning sickness that makes them vomit all the time. This is a more serious problem that needs treatment. What are the causes? The cause of this condition is not known. What increases the risk? You had vomiting or a feeling like you may vomit before your pregnancy. You had morning sickness in another pregnancy. You are pregnant with more than one baby, such as twins. What are the signs or symptoms? Feeling like you  may vomit. Vomiting. How is this treated? Treatment is usually not needed for this condition. You may only need to change what you eat. In some cases, your doctor may give you some things to take for your condition. These include: Vitamin B6 supplements. Medicines to treat the feeling that you may vomit. Ginger. Follow these instructions at home: Medicines Take over-the-counter and prescription medicines only as told by your doctor. Do not take any medicines until you talk with your doctor about them first. Take multivitamins before you get pregnant. These can stop or lessen the symptoms of morning sickness. Eating and drinking Eat dry toast or crackers before getting out of bed. Eat 5 or 6 small meals a day. Eat dry and bland foods like rice and baked potatoes. Do not eat greasy, fatty, or spicy foods. Have someone cook for you if the smell of food causes you to vomit or to feel like you may vomit. If you feel like you may vomit after taking prenatal vitamins, take them at night or with a snack. Eat protein foods when you need a snack. Nuts, yogurt, and cheese are good choices. Drink fluids throughout the day. Try ginger ale made with real ginger, ginger tea made from fresh grated ginger, or ginger candies. General instructions Do not smoke or use any products that contain nicotine or tobacco. If you need help quitting, ask your doctor. Use an air purifier to keep the air in your house free of smells. Get lots of fresh air. Try to avoid smells that make you feel sick. Try wearing an acupressure wristband. This is a wristband that is used to treat seasickness. Try a treatment called acupuncture. In this treatment, a doctor puts needles into certain areas of your body to make you feel better. Contact a doctor if: You need medicine to feel better. You feel dizzy or light-headed. You are losing weight. Get help right away if: The feeling that you may vomit will not go away, or you  cannot stop vomiting. You faint. You have very bad pain in your belly. Summary Morning sickness is when you feel like you may vomit (feel nauseous) during pregnancy. You may feel sick in the morning, but you can feel this way at any time of the day. Making some changes to what you eat may help your symptoms go away. This information is not intended to replace advice given to you by your health care provider. Make sure you discuss any questions you have with your health care provider. Document Revised: 05/17/2020 Document Reviewed: 04/26/2020 Elsevier Patient Education  [place].   Common Medications Safe in Pregnancy  Acne:      Constipation:  Benzoyl Peroxide     Colace  Clindamycin      Dulcolax Suppository  Topica Erythromycin     Fibercon  Salicylic Acid      Metamucil         Miralax AVOID:        Senakot   Accutane    Cough:  Retin-A       Cough Drops  Tetracycline      Phenergan w/ Codeine if Rx  Minocycline      Robitussin (Plain & DM)  Antibiotics:     Crabs/Lice:  Ceclor       RID  Cephalosporins    AVOID:  E-Mycins      Kwell  Keflex  Macrobid/Macrodantin   Diarrhea:  Penicillin      Kao-Pectate  Zithromax      Imodium AD         PUSH FLUIDS AVOID:       Cipro     Fever:  Tetracycline      Tylenol (Regular or Extra  Minocycline       Strength)  Levaquin      Extra Strength-Do not          Exceed 8 tabs/24 hrs Caffeine:        <227m/day (equiv. To 1 cup of coffee or  approx. 3 12 oz sodas)         Gas: Cold/Hayfever:       Gas-X  Benadryl      Mylicon  Claritin       Phazyme  **Claritin-D        Chlor-Trimeton    Headaches:  Dimetapp      ASA-Free Excedrin  Drixoral-Non-Drowsy     Cold Compress  Mucinex (Guaifenasin)     Tylenol (Regular or Extra  Sudafed/Sudafed-12 Hour     Strength)  **Sudafed PE Pseudoephedrine   Tylenol Cold & Sinus     Vicks Vapor Rub  Zyrtec  **AVOID if Problems With Blood Pressure         Heartburn: Avoid  lying down for at least 1 hour after meals  Aciphex      Maalox     Rash:  Milk of Magnesia     Benadryl    Mylanta       1% Hydrocortisone Cream  Pepcid  Pepcid Complete   Sleep Aids:  Prevacid      Ambien   Prilosec       Benadryl  Rolaids       Chamomile Tea  Tums (Limit 4/day)     Unisom         Tylenol PM         Warm milk-add vanilla or  Hemorrhoids:       Sugar for taste  Anusol/Anusol H.C.  (RX:  Analapram 2.5%)  Sugar Substitutes:  Hydrocortisone OTC     Ok in moderation  Preparation H      Tucks        Vaseline lotion applied to tissue with wiping    Herpes:     Throat:  Acyclovir      Oragel  Famvir  Valtrex     Vaccines:         Flu Shot Leg Cramps:       *Gardasil  Benadryl      Hepatitis A         Hepatitis B Nasal Spray:       Pneumovax  Saline Nasal Spray     Polio Booster         Tetanus Nausea:       Tuberculosis test or PPD  Vitamin B6 25 mg TID   AVOID:    Dramamine      *Gardasil  Emetrol       Live Poliovirus  Ginger Root 250 mg QID    MMR (measles, mumps &  High Complex Carbs @ Bedtime    rebella)  Sea Bands-Accupressure    Varicella (Chickenpox)  Unisom 1/2 tab TID     *No known complications           If received before Pain:         Known pregnancy;   Darvocet       Resume series after  Lortab        Delivery  Percocet    Yeast:   Tramadol      Femstat  Tylenol 3      Gyne-lotrimin  Ultram       Monistat  Vicodin           MISC:         All Sunscreens           Hair Coloring/highlights          Insect Repellant's          (Including DEET)         Mystic Tans

## 2022-08-21 NOTE — Progress Notes (Signed)
    Nurse Visit Note  Subjective:    Patient ID: Natalie Pierce, female    DOB: 1995-12-27, 27 y.o.   MRN: [id number]  Patient is a 26 y.o. [id number] female who presents for Patient presents today for pregnancy confirmation. UPT resulted as positive. LMP 06/29/2022.  Pregnancy is desired. Pregnancy was unplanned. Sexual Activity: 1 partner    Contraception: Nexplanon/Vaginal Rings    Current symptoms also include: 1 Positive home pregnancy test. Last menstrual period was normal. She has been advised to schedule a NOB Nurse Interview at checkout. I also collected her NOB Urine labs.  Patient states no other questions or concerns.

## 2022-08-22 LAB — URINALYSIS, ROUTINE W REFLEX MICROSCOPIC
Bilirubin, UA: NEGATIVE
Glucose, UA: NEGATIVE
Ketones, UA: NEGATIVE
Leukocytes,UA: NEGATIVE
Nitrite, UA: NEGATIVE
RBC, UA: NEGATIVE
Specific Gravity, UA: >=1.03 — AB (ref 1.005–1.030)
Urobilinogen, Ur: 0.2 mg/dL (ref 0.2–1.0)
pH, UA: 6 (ref 5.0–7.5)

## 2022-08-22 LAB — PAIN MGT SCRN (14 DRUGS), UR
Amphetamine Scrn, Ur: NEGATIVE ng/mL
BARBITURATE SCREEN URINE: NEGATIVE ng/mL
BENZODIAZEPINE SCREEN, URINE: NEGATIVE ng/mL
Buprenorphine, Urine: NEGATIVE ng/mL
CANNABINOIDS UR QL SCN: NEGATIVE ng/mL
Cocaine (Metab) Scrn, Ur: NEGATIVE ng/mL
Creatinine(Crt), U: 292.7 mg/dL (ref 20.0–300.0)
Fentanyl, Urine: NEGATIVE pg/mL
Meperidine Screen, Urine: NEGATIVE ng/mL
Methadone Screen, Urine: NEGATIVE ng/mL
OXYCODONE+OXYMORPHONE UR QL SCN: NEGATIVE ng/mL
Opiate Scrn, Ur: NEGATIVE ng/mL
Ph of Urine: 5.7 (ref 4.5–8.9)
Phencyclidine Qn, Ur: NEGATIVE ng/mL
Propoxyphene Scrn, Ur: NEGATIVE ng/mL
Tramadol Screen, Urine: NEGATIVE ng/mL

## 2022-08-23 LAB — URINE CYTOLOGY ANCILLARY ONLY
Chlamydia: NEGATIVE
Comment: NEGATIVE
Comment: NORMAL
Neisseria Gonorrhea: NEGATIVE

## 2022-08-23 LAB — CULTURE, OB URINE

## 2022-08-23 LAB — URINE CULTURE, OB REFLEX

## 2022-09-04 ENCOUNTER — Ambulatory Visit: Payer: BC Managed Care – PPO

## 2022-09-06 ENCOUNTER — Ambulatory Visit (INDEPENDENT_AMBULATORY_CARE_PROVIDER_SITE_OTHER): Payer: BC Managed Care – PPO

## 2022-09-06 DIAGNOSIS — Z3480 Encounter for supervision of other normal pregnancy, unspecified trimester: Secondary | ICD-10-CM | POA: Insufficient documentation

## 2022-09-06 DIAGNOSIS — O0992 Supervision of high risk pregnancy, unspecified, second trimester: Secondary | ICD-10-CM | POA: Insufficient documentation

## 2022-09-06 DIAGNOSIS — Z3689 Encounter for other specified antenatal screening: Secondary | ICD-10-CM

## 2022-09-06 DIAGNOSIS — Z369 Encounter for antenatal screening, unspecified: Secondary | ICD-10-CM

## 2022-09-06 NOTE — Progress Notes (Signed)
New OB Intake  I connected with  Natalie Pierce on 09/06/22 at  2:15 PM EST by telephone and verified that I am speaking with the correct person using two identifiers. Nurse is located at [facility] and pt is located at home.  I explained I am completing New OB Intake today. We discussed her EDD of 04/05/2023 that is based on LMP of 06/29/2022. Pt is G4/P3002. I reviewed her allergies, medications, Medical/Surgical/OB history, and appropriate screenings. Based on history, this is a/an pregnancy uncomplicated .   Patient Active Problem List   Diagnosis Date Noted   Postpartum hypertension 01/18/2022   History of IUFD 01/04/2022   Decreased fetus movements affecting management of mother in third trimester    Pregnancy 01/01/2022   History of pregnancy loss in prior pregnancy, currently pregnant in third trimester 12/30/2021   IUFD at 20 weeks or more of gestation 10/15/2018   Supervision of other normal pregnancy, antepartum 03/14/2018    Concerns addressed today None  Delivery Plans:  Plans to deliver at [facility].  Anatomy US Explained first scheduled [place] will be on 12/4th and an anatomy scan will be done at 20 weeks. Labs Discussed genetic screening with patient. Patient desires genetic testing to be drawn with new OB labs. Discussed possible labs to be drawn at new OB appointment.  COVID Vaccine Patient has not had COVID vaccine.   Social Determinants of Health Food Insecurity: denies food insecurity Transportation: Patient denies transportation needs. Childcare: Discussed no children allowed at ultrasound appointments.   First visit review I reviewed new OB appt with pt. I explained she will have ob bloodwork and pap smear/pelvic exam if indicated. Explained pt will be seen by Doreene Burke, CNM at first visit; encounter routed to appropriate provider.   Natalie Pierce, [facility] 09/06/2022  2:31 PM

## 2022-09-18 ENCOUNTER — Other Ambulatory Visit: Payer: BC Managed Care – PPO

## 2022-09-18 ENCOUNTER — Ambulatory Visit (INDEPENDENT_AMBULATORY_CARE_PROVIDER_SITE_OTHER): Payer: BC Managed Care – PPO

## 2022-09-18 DIAGNOSIS — Z3687 Encounter for antenatal screening for uncertain dates: Secondary | ICD-10-CM

## 2022-09-18 DIAGNOSIS — Z369 Encounter for antenatal screening, unspecified: Secondary | ICD-10-CM

## 2022-09-18 DIAGNOSIS — Z3A11 11 weeks gestation of pregnancy: Secondary | ICD-10-CM

## 2022-09-18 DIAGNOSIS — Z3480 Encounter for supervision of other normal pregnancy, unspecified trimester: Secondary | ICD-10-CM

## 2022-09-19 LAB — CBC/D/PLT+RPR+RH+ABO+RUBIGG...
Antibody Screen: NEGATIVE
Basophils Absolute: 0 10*3/uL (ref 0.0–0.2)
Basos: 0 %
EOS (ABSOLUTE): 0.1 10*3/uL (ref 0.0–0.4)
Eos: 1 %
HCV Ab: NONREACTIVE
HIV Screen 4th Generation wRfx: NONREACTIVE
Hematocrit: 36.5 % (ref 34.0–46.6)
Hemoglobin: 12.3 g/dL (ref 11.1–15.9)
Hepatitis B Surface Ag: NEGATIVE
Immature Grans (Abs): 0 10*3/uL (ref 0.0–0.1)
Immature Granulocytes: 0 %
Lymphocytes Absolute: 2.1 10*3/uL (ref 0.7–3.1)
Lymphs: 26 %
MCH: 28.5 pg (ref 26.6–33.0)
MCHC: 33.7 g/dL (ref 31.5–35.7)
MCV: 85 fL (ref 79–97)
Monocytes Absolute: 0.6 10*3/uL (ref 0.1–0.9)
Monocytes: 8 %
Neutrophils Absolute: 5.2 10*3/uL (ref 1.4–7.0)
Neutrophils: 65 %
Platelets: 326 10*3/uL (ref 150–450)
RBC: 4.31 x10E6/uL (ref 3.77–5.28)
RDW: 12.8 % (ref 11.7–15.4)
RPR Ser Ql: NONREACTIVE
Rh Factor: POSITIVE
Rubella Antibodies, IGG: 8.3 index (ref >0.99)
Varicella zoster IgG: 2469 index (ref >165)
WBC: 8 10*3/uL (ref 3.4–10.8)

## 2022-09-19 LAB — HCV INTERPRETATION

## 2022-09-28 ENCOUNTER — Encounter: Payer: Self-pay | Admitting: Certified Nurse Midwife

## 2022-09-28 ENCOUNTER — Ambulatory Visit (INDEPENDENT_AMBULATORY_CARE_PROVIDER_SITE_OTHER): Payer: BC Managed Care – PPO | Admitting: Certified Nurse Midwife

## 2022-09-28 VITALS — BP 120/83 | HR 86 | Wt 195.2 lb

## 2022-09-28 DIAGNOSIS — Z3A13 13 weeks gestation of pregnancy: Secondary | ICD-10-CM | POA: Diagnosis not present

## 2022-09-28 DIAGNOSIS — Z3481 Encounter for supervision of other normal pregnancy, first trimester: Secondary | ICD-10-CM | POA: Diagnosis not present

## 2022-09-28 LAB — POCT URINALYSIS DIPSTICK OB
Bilirubin, UA: NEGATIVE
Glucose, UA: NEGATIVE
Ketones, UA: NEGATIVE
Leukocytes, UA: NEGATIVE
Nitrite, UA: NEGATIVE
POC,PROTEIN,UA: NEGATIVE
Spec Grav, UA: <=1.005 — AB (ref 1.010–1.025)
Urobilinogen, UA: 0.2 E.U./dL
pH, UA: 6.5 (ref 5.0–8.0)

## 2022-09-28 MED ORDER — ASPIRIN 81 MG PO TBEC: 81.0000 mg | DELAYED_RELEASE_TABLET | Freq: Every day | ORAL | 12 refills | Status: DC

## 2022-09-28 NOTE — Addendum Note (Signed)
Addended by: Fonda Kinder on: 09/28/2022 03:35 PM   Modules accepted: Orders

## 2022-09-28 NOTE — Progress Notes (Signed)
NEW OB HISTORY AND PHYSICAL  SUBJECTIVE:       Natalie Pierce is a 26 y.o. [phone] female, Patient's last menstrual period was 06/29/2022 (exact date)., Estimated Date of Delivery: 04/05/23, [redacted]w[redacted]d, presents today for establishment of Prenatal Care. She has no unusual complaints   Body mass index is 32.48 kg/m.   Social Relationship; Partner x 12 yrs Living with her partner and her 2 sons Work : Health and safety inspector Exercise : none  Alcohol/Drugs/Smoking: denies use    Gynecologic History Patient's last menstrual period was 06/29/2022 (exact date). Normal Contraception: none Last Pap: 07/14/21. Results were: normal  Obstetric History OB History  Gravida Para Term Preterm AB Living  4 3 3  0 0 2  SAB IAB Ectopic Multiple Live Births  0 0 0 0 2    # Outcome Date GA Lbr Len/2nd Weight Sex Delivery Anes PTL Lv  4 Current           3 Term 01/04/22 [redacted]w[redacted]d  8 lb 8 oz (3.856 kg) M Vag-Spont EPI N LIV  2 Term 10/15/18 [redacted]w[redacted]d 07:22 / 01:23 6 lb 7.7 oz (2.939 kg) F Vag-Spont EPI  FD  1 Term 10/05/13 [redacted]w[redacted]d  7 lb 11 oz (3.487 kg) M Vag-Spont   LIV    Past Medical History:  Diagnosis Date   Anemia     Past Surgical History:  Procedure Laterality Date   WISDOM TOOTH EXTRACTION      Current Outpatient Medications on File Prior to Visit  Medication Sig Dispense Refill   Prenatal Vit-Fe Fumarate-FA (PRENATAL VITAMINS PO) Take by mouth.     No current facility-administered medications on file prior to visit.    No Known Allergies  Social History   Socioeconomic History   Marital status: Single    Spouse name: Not on file   Number of children: 2   Years of education: 16   Highest education level: Not on file  Occupational History   Occupation: lab tech  Tobacco Use   Smoking status: Never   Smokeless tobacco: Never  Vaping Use   Vaping Use: Never used  Substance and Sexual Activity   Alcohol use: Not Currently    Comment: Occ   Drug use: No   Sexual activity: Yes     Partners: Male    Birth control/protection: None  Other Topics Concern   Not on file  Social History Narrative   Not on file   Social Determinants of Health   Financial Resource Strain: Low Risk  (09/06/2022)   Overall Financial Resource Strain (CARDIA)    Difficulty of Paying Living Expenses: Not very hard  Food Insecurity: No Food Insecurity (09/06/2022)   Hunger Vital Sign    Worried About Running Out of Food in the Last Year: Never true    Ran Out of Food in the Last Year: Never true  Transportation Needs: No Transportation Needs (09/06/2022)   PRAPARE - 09/08/2022 (Medical): No    Lack of Transportation (Non-Medical): No  Physical Activity: Inactive (09/06/2022)   Exercise Vital Sign    Days of Exercise per Week: 0 days    Minutes of Exercise per Session: 0 min  Stress: No Stress Concern Present (09/06/2022)   09/08/2022 of Occupational Health - Occupational Stress Questionnaire    Feeling of Stress : Not at all  Social Connections: Unknown (09/06/2022)   Social Connection and Isolation Panel [NHANES]    Frequency of Communication with  Friends and Family: Twice a week    Frequency of Social Gatherings with Friends and Family: Once a week    Attends Religious Services: More than 4 times per year    Active Member of [employer] or Organizations: No    Attends Banker Meetings: Never    Marital Status: Not on file  Intimate Partner Violence: Not At Risk (09/06/2022)   Humiliation, Afraid, Rape, and Kick questionnaire    Fear of Current or Ex-Partner: No    Emotionally Abused: No    Physically Abused: No    Sexually Abused: No    Family History  Problem Relation Age of Onset   Healthy Mother    Healthy Father    Healthy Brother    Healthy Brother    Healthy Brother    Lupus Maternal Grandmother    Healthy Maternal Grandfather    Healthy Paternal Grandmother    Healthy Paternal Grandfather    Healthy Half-Sister     Breast cancer Neg Hx    Ovarian cancer Neg Hx    Colon cancer Neg Hx     The following portions of the patient's history were reviewed and updated as appropriate: allergies, current medications, past OB history, past medical history, past surgical history, past family history, past social history, and problem list.    OBJECTIVE: Initial Physical Exam (New OB)  GENERAL APPEARANCE: alert, well appearing, in no apparent distress, oriented to person, place and time HEAD: normocephalic, atraumatic MOUTH: mucous membranes moist, pharynx normal without lesions THYROID: no thyromegaly or masses present BREASTS: no masses noted, no significant tenderness, no palpable axillary nodes, no skin changes LUNGS: clear to auscultation, no wheezes, rales or rhonchi, symmetric air entry HEART: regular rate and rhythm, no murmurs ABDOMEN: soft, nontender, nondistended, no abnormal masses, no epigastric pain, obese, and FHT present EXTREMITIES: no redness or tenderness in the calves or thighs SKIN: normal coloration and turgor, no rashes LYMPH NODES: no adenopathy palpable NEUROLOGIC: alert, oriented, normal speech, no focal findings or movement disorder noted  PELVIC EXAM deferred, pap not due, tested pelvis  ASSESSMENT: Normal pregnancy  PLAN: Prenatal care See ordersNew OB counseling: The patient has been given an overview regarding routine prenatal care. Recommendations regarding diet, weight gain, and exercise in pregnancy were given. Prenatal testing, optional genetic testing, carrier screening , and ultrasound use in pregnancy were reviewed.  Maternit collected today . Benefits of Breast Feeding were discussed. The patient is encouraged to consider nursing her baby post partum. Discussed use of baby aspirin at 14 wks. She agrees. Follow up 4 wks.   Doreene Burke, CNM

## 2022-09-28 NOTE — Addendum Note (Signed)
Addended by: Fonda Kinder on: 09/28/2022 03:57 PM   Modules accepted: Orders

## 2022-09-28 NOTE — Addendum Note (Signed)
Addended by: Fonda Kinder on: 09/28/2022 03:37 PM   Modules accepted: Orders

## 2022-09-28 NOTE — Patient Instructions (Signed)

## 2022-10-02 LAB — MATERNIT 21 PLUS CORE, BLOOD
Fetal Fraction: 7
Result (T21): NEGATIVE
Trisomy 13 (Patau syndrome): NEGATIVE
Trisomy 18 (Edwards syndrome): NEGATIVE
Trisomy 21 (Down syndrome): NEGATIVE

## 2022-10-16 NOTE — L&D Delivery Note (Addendum)
LABOR COURSE IOL with cytotec and pitocin. Labor progressed well. Patient unable to receive epidural 2/2 complete and pushing @ 0938.  Delivery Note Called to room and patient was complete and pushing. Moderate to thick meconium present, NICU delivery call made. Head delivered. No nuchal cord present. Shoulder and body delivered in usual fashion. At  [phone] a viable female was delivered via Vaginal, Spontaneous (Presentation:Vertex ;OA  ).  Infant with spontaneous cry, placed on mother's abdomen, dried and stimulated. Cord clamped x 2 after 1-minute delay, and cut by FOB. Cord blood drawn. Placenta delivered spontaneously with gentle cord traction. Appears intact. Fundus firm with massage and Pitocin. Labia, perineum, vagina, and cervix inspected with 2 cm length 1st degree perineal laceration.    APGAR: 9,9 ; weight  .   Cord: 3VC  Cord pH: Not send  Anesthesia: Local Episiotomy: None Lacerations: 1st degree perineal Suture Repair: 3.0 Monocryl  Est. Blood Loss (mL): 75  Mom to postpartum.  Baby to Couplet care / Skin to Skin.  Tiffany Kocher, DO 03/31/2023 10:01 AM    GME ATTESTATION:  I saw and evaluated the patient. I agree with the findings and the plan of care as documented in the resident's note. I have made changes to documentation as necessary.  Lavonda Jumbo, DO OB Fellow, Faculty [facility], Center for [facility] 03/31/2023, 11:15 AM

## 2022-10-25 ENCOUNTER — Encounter: Payer: BC Managed Care – PPO | Admitting: Obstetrics

## 2022-10-30 ENCOUNTER — Ambulatory Visit (INDEPENDENT_AMBULATORY_CARE_PROVIDER_SITE_OTHER): Payer: BC Managed Care – PPO | Admitting: Obstetrics and Gynecology

## 2022-10-30 VITALS — BP 129/89 | HR 94 | Wt 196.0 lb

## 2022-10-30 DIAGNOSIS — Z8759 Personal history of other complications of pregnancy, childbirth and the puerperium: Secondary | ICD-10-CM

## 2022-10-30 DIAGNOSIS — Z3480 Encounter for supervision of other normal pregnancy, unspecified trimester: Secondary | ICD-10-CM

## 2022-10-30 DIAGNOSIS — O364XX0 Maternal care for intrauterine death, not applicable or unspecified: Secondary | ICD-10-CM

## 2022-10-30 DIAGNOSIS — O09292 Supervision of pregnancy with other poor reproductive or obstetric history, second trimester: Secondary | ICD-10-CM | POA: Diagnosis not present

## 2022-10-30 DIAGNOSIS — Z3A17 17 weeks gestation of pregnancy: Secondary | ICD-10-CM | POA: Diagnosis not present

## 2022-10-30 DIAGNOSIS — O165 Unspecified maternal hypertension, complicating the puerperium: Secondary | ICD-10-CM

## 2022-10-30 DIAGNOSIS — O0992 Supervision of high risk pregnancy, unspecified, second trimester: Secondary | ICD-10-CM

## 2022-10-30 DIAGNOSIS — O09293 Supervision of pregnancy with other poor reproductive or obstetric history, third trimester: Secondary | ICD-10-CM

## 2022-10-30 NOTE — Progress Notes (Unsigned)
Transfer OB pt   AFP today   CC: None

## 2022-10-31 NOTE — Progress Notes (Signed)
   PRENATAL VISIT NOTE  Transfer of care from [facility]  Subjective:  Natalie Pierce is a 27 y.o. [id number] at [redacted]w[redacted]d being seen today for ongoing prenatal care.  She is currently monitored for the following issues for this high-risk pregnancy and has History of IUFD; Postpartum hypertension; and Supervision of high risk pregnancy in second trimester on their problem list.  Patient reports no complaints.  Contractions: Not present. Vag. Bleeding: None.  Movement: Present. Denies leaking of fluid.   The following portions of the patient's history were reviewed and updated as appropriate: allergies, current medications, past family history, past medical history, past social history, past surgical history and problem list.   Objective:   Vitals:   10/30/22 1521  BP: 129/89  Pulse: 94  Weight: 196 lb (88.9 kg)    Fetal Status: Fetal Heart Rate (bpm): 154   Movement: Present     General:  Alert, oriented and cooperative. Patient is in no acute distress.  Skin: Skin is warm and dry. No rash noted.   Cardiovascular: Normal heart rate noted  Respiratory: Normal respiratory effort, no problems with respiration noted  Abdomen: Soft, gravid, appropriate for gestational age.  Pain/Pressure: Absent     Pelvic: Cervical exam deferred        Extremities: Normal range of motion.  Edema: None  Mental Status: Normal mood and affect. Normal behavior. Normal judgment and thought content.   Assessment and Plan:  Pregnancy: [id number] at [redacted]w[redacted]d 1. Supervision of high risk pregnancy, antepartum Patient confirms on low dose ASA; she states she was on this last pregnancy Patient transfers due to was originally with [facility] but didn't want to continue with them b/c they moved to old [place] office (OB practice that originally saw her for her IUFD pregnancy) and having to see multiple providers at the new office. I told her re: our practice here and hospital on call and delivery, etc and how she can see  the same few MDs and CNMs here but delivery would be with whoever is on call and she is okay with that - AFP, Serum, Open Spina Bifida - [place] MFM OB DETAIL +14 WK; Future  2. Supervision of high risk pregnancy in second trimester - Comprehensive metabolic panel - Protein / creatinine ratio, urine - Hemoglobin A1c - TSH Rfx on Abnormal to Free T4  3. Postpartum hypertension  4. History of pregnancy loss in prior pregnancy, currently pregnant in third trimester 09/2018 IUFD at 39wks. Uncomplicated pregnancy. Nothing unusual about baby, placenta or cord at the time of delivery. Placenta showed an umbilical vein thrombosis, negative for cmv and parvo Negative LAC, ACAparvo igm, rpr, hiv, kb, fibrinogen, uds, ucx, ft4 Qmonth growth u/s and start qwk testing at 32wks and IOL at 39wks at the latest. F/u any mfm recs re: earlier delivery timing     Preterm labor symptoms and general obstetric precautions including but not limited to vaginal bleeding, contractions, leaking of fluid and fetal movement were reviewed in detail with the patient. Please refer to After Visit Summary for other counseling recommendations.   Return in about 3 weeks (around 11/20/2022) for 3-4wks, low risk ob, in person, md or app.  Future Appointments  Date Time Provider [place]  11/23/2022  7:30 AM [facility] NURSE [facility]  11/23/2022  7:45 AM [facility]5 [facility]    Aletha Halim, MD

## 2022-11-01 LAB — COMPREHENSIVE METABOLIC PANEL
ALT: 10 IU/L (ref 0–32)
AST: 12 IU/L (ref 0–40)
Albumin/Globulin Ratio: 1.3 (ref 1.2–2.2)
Albumin: 3.9 g/dL — ABNORMAL LOW (ref 4.0–5.0)
Alkaline Phosphatase: 75 IU/L (ref 44–121)
BUN/Creatinine Ratio: 8 — ABNORMAL LOW (ref 9–23)
BUN: 5 mg/dL — ABNORMAL LOW (ref 6–20)
Bilirubin Total: 0.2 mg/dL (ref 0.0–1.2)
CO2: 22 mmol/L (ref 20–29)
Calcium: 9.9 mg/dL (ref 8.7–10.2)
Chloride: 104 mmol/L (ref 96–106)
Creatinine, Ser: 0.62 mg/dL (ref 0.57–1.00)
Globulin, Total: 3 g/dL (ref 1.5–4.5)
Glucose: 88 mg/dL (ref 70–99)
Potassium: 4.3 mmol/L (ref 3.5–5.2)
Sodium: 138 mmol/L (ref 134–144)
Total Protein: 6.9 g/dL (ref 6.0–8.5)
eGFR: 125 mL/min/{1.73_m2} (ref >59)

## 2022-11-01 LAB — PROTEIN / CREATININE RATIO, URINE
Creatinine, Urine: 401.6 mg/dL
Protein, Ur: 27.6 mg/dL
Protein/Creat Ratio: 69 mg/g creat (ref 0–200)

## 2022-11-01 LAB — AFP, SERUM, OPEN SPINA BIFIDA
AFP MoM: 1.22
AFP Value: 43.8 ng/mL
Gest. Age on Collection Date: 17 weeks
Maternal Age At EDD: 27.6 yr
OSBR Risk 1 IN: 10000
Test Results:: NEGATIVE
Weight: 196 [lb_av]

## 2022-11-01 LAB — TSH RFX ON ABNORMAL TO FREE T4: TSH: 1.54 u[IU]/mL (ref 0.450–4.500)

## 2022-11-01 LAB — HEMOGLOBIN A1C
Est. average glucose Bld gHb Est-mCnc: 111 mg/dL
Hgb A1c MFr Bld: 5.5 % (ref 4.8–5.6)

## 2022-11-17 ENCOUNTER — Telehealth: Payer: Self-pay

## 2022-11-17 NOTE — Telephone Encounter (Signed)
LEFT MESSAGE FOR PT TO CALL OFFICE BACK REGARDING MOVING HER APPT UP ON 2/6

## 2022-11-20 ENCOUNTER — Encounter: Payer: BC Managed Care – PPO | Admitting: Obstetrics and Gynecology

## 2022-11-21 ENCOUNTER — Ambulatory Visit (INDEPENDENT_AMBULATORY_CARE_PROVIDER_SITE_OTHER): Payer: BC Managed Care – PPO | Admitting: Obstetrics and Gynecology

## 2022-11-21 ENCOUNTER — Encounter: Payer: Self-pay | Admitting: Obstetrics and Gynecology

## 2022-11-21 VITALS — BP 126/88 | HR 98 | Wt 199.0 lb

## 2022-11-21 DIAGNOSIS — Z3A20 20 weeks gestation of pregnancy: Secondary | ICD-10-CM

## 2022-11-21 DIAGNOSIS — Z8759 Personal history of other complications of pregnancy, childbirth and the puerperium: Secondary | ICD-10-CM

## 2022-11-21 DIAGNOSIS — O162 Unspecified maternal hypertension, second trimester: Secondary | ICD-10-CM

## 2022-11-21 DIAGNOSIS — O0992 Supervision of high risk pregnancy, unspecified, second trimester: Secondary | ICD-10-CM

## 2022-11-21 DIAGNOSIS — O165 Unspecified maternal hypertension, complicating the puerperium: Secondary | ICD-10-CM

## 2022-11-21 NOTE — Progress Notes (Signed)
ROB [redacted]w[redacted]d  AFP WNL   CC: None

## 2022-11-21 NOTE — Progress Notes (Signed)
   PRENATAL VISIT NOTE  Subjective:  Natalie Pierce is a 27 y.o. [id number] at [redacted]w[redacted]d being seen today for ongoing prenatal care.  She is currently monitored for the following issues for this high-risk pregnancy and has History of IUFD; Postpartum hypertension; and Supervision of high risk pregnancy in second trimester on their problem list.  Patient reports no complaints.  Contractions: Not present. Vag. Bleeding: None.  Movement: Present. Denies leaking of fluid.   The following portions of the patient's history were reviewed and updated as appropriate: allergies, current medications, past family history, past medical history, past social history, past surgical history and problem list.   Objective:   Vitals:   11/21/22 1428  BP: 126/88  Pulse: 98  Weight: 199 lb (90.3 kg)    Fetal Status: Fetal Heart Rate (bpm): 154 Fundal Height: 22 cm Movement: Present     General:  Alert, oriented and cooperative. Patient is in no acute distress.  Skin: Skin is warm and dry. No rash noted.   Cardiovascular: Normal heart rate noted  Respiratory: Normal respiratory effort, no problems with respiration noted  Abdomen: Soft, gravid, appropriate for gestational age.  Pain/Pressure: Absent     Pelvic: Cervical exam deferred        Extremities: Normal range of motion.  Edema: None  Mental Status: Normal mood and affect. Normal behavior. Normal judgment and thought content.   Assessment and Plan:  Pregnancy: G4P3002 at [redacted]w[redacted]d 1. [redacted] weeks gestation of pregnancy F/u anatomy u/s next week Continue low dose ASA  2. History of IUFD 2019 39wks. S/p 2022 TSVD w/o any issues  3. Postpartum hypertension  4. Supervision of high risk pregnancy in second trimester  Preterm labor symptoms and general obstetric precautions including but not limited to vaginal bleeding, contractions, leaking of fluid and fetal movement were reviewed in detail with the patient. Please refer to After Visit Summary for other  counseling recommendations.   No follow-ups on file.  Future Appointments  Date Time Provider [place]  11/29/2022  2:15 PM [facility] NURSE [facility]  11/29/2022  2:30 PM [facility] US2 [facility]  12/18/2022  2:30 PM Anyanwu, Sallyanne Havers, MD [facility]    Aletha Halim, MD

## 2022-11-23 ENCOUNTER — Other Ambulatory Visit: Payer: BC Managed Care – PPO

## 2022-11-23 ENCOUNTER — Ambulatory Visit: Payer: BC Managed Care – PPO

## 2022-11-29 ENCOUNTER — Ambulatory Visit: Payer: BC Managed Care – PPO | Admitting: *Deleted

## 2022-11-29 ENCOUNTER — Other Ambulatory Visit: Payer: Self-pay | Admitting: *Deleted

## 2022-11-29 ENCOUNTER — Encounter: Payer: Self-pay | Admitting: *Deleted

## 2022-11-29 ENCOUNTER — Ambulatory Visit: Payer: BC Managed Care – PPO | Attending: Obstetrics and Gynecology

## 2022-11-29 VITALS — BP 130/82 | HR 97

## 2022-11-29 DIAGNOSIS — O0992 Supervision of high risk pregnancy, unspecified, second trimester: Secondary | ICD-10-CM

## 2022-11-29 DIAGNOSIS — Z3480 Encounter for supervision of other normal pregnancy, unspecified trimester: Secondary | ICD-10-CM

## 2022-11-29 DIAGNOSIS — Z362 Encounter for other antenatal screening follow-up: Secondary | ICD-10-CM

## 2022-11-29 DIAGNOSIS — Z363 Encounter for antenatal screening for malformations: Secondary | ICD-10-CM | POA: Diagnosis not present

## 2022-11-29 DIAGNOSIS — O09892 Supervision of other high risk pregnancies, second trimester: Secondary | ICD-10-CM

## 2022-11-29 DIAGNOSIS — Z3A21 21 weeks gestation of pregnancy: Secondary | ICD-10-CM | POA: Diagnosis not present

## 2022-11-29 DIAGNOSIS — O09292 Supervision of pregnancy with other poor reproductive or obstetric history, second trimester: Secondary | ICD-10-CM | POA: Insufficient documentation

## 2022-11-29 DIAGNOSIS — Z8759 Personal history of other complications of pregnancy, childbirth and the puerperium: Secondary | ICD-10-CM

## 2022-12-18 ENCOUNTER — Ambulatory Visit (INDEPENDENT_AMBULATORY_CARE_PROVIDER_SITE_OTHER): Payer: BC Managed Care – PPO | Admitting: Obstetrics & Gynecology

## 2022-12-18 VITALS — BP 129/81 | HR 101 | Wt 202.0 lb

## 2022-12-18 DIAGNOSIS — Z3A24 24 weeks gestation of pregnancy: Secondary | ICD-10-CM

## 2022-12-18 DIAGNOSIS — O0992 Supervision of high risk pregnancy, unspecified, second trimester: Secondary | ICD-10-CM

## 2022-12-18 DIAGNOSIS — Z8759 Personal history of other complications of pregnancy, childbirth and the puerperium: Secondary | ICD-10-CM

## 2022-12-18 NOTE — Patient Instructions (Signed)
You need to be fasting before next visit. No food or drink after midnight!   Oral Glucose Tolerance Test During Pregnancy Why am I having this test? The oral glucose tolerance test (GTT) is done to check how your body processes blood sugar (glucose). This is one of several tests used to diagnose diabetes that develops during pregnancy (gestational diabetes mellitus). Gestational diabetes is a short-term form of diabetes that some women develop while they are pregnant. It usually occurs during the second or third trimester of pregnancy and goes away after delivery. Testing, or screening, for gestational diabetes usually occurs around 83 of pregnancy. This test may also be needed earlier if: You have a history of gestational diabetes. There is a history of giving birth to very large babies or of losing pregnancies (having stillbirths). You have signs and symptoms of diabetes, such as: Changes in your eyesight. Tingling or numbness in your hands or feet. Changes in hunger, thirst, and urination, and these are not explained by your pregnancy. What is being tested? This test measures the amount of glucose in your blood at different times during a period of 2 hours. This shows how well your body can process glucose.  You will have three separate blood draws. What kind of sample is taken?  Blood samples are required for this test. They are usually collected by inserting a needle into a blood vessel. How do I prepare for this test? For 3 days before your test, eat normally. Have plenty of carbohydrate-rich foods. You will be asked not to eat or drink anything other than water (to fast) starting 8-10 hours before the test. Tell a health care provider about: All medicines you are taking, including vitamins, herbs, eye drops, creams, and over-the-counter medicines. Any blood disorders you have. Any surgeries you have had. Any medical conditions you have. What happens during the test? First, your  blood glucose will be measured. This is referred to as your fasting blood glucose because you fasted before the test. Then, you will drink a glucose solution that contains a certain amount of glucose. Your blood glucose will be measured again 1 and 2 hours after you drink the solution. This test takes about 2 hours to complete. You will need to stay at the testing location during this time. During the testing period: Do not eat or drink anything other than the glucose solution. Do not exercise. Do not use any products that contain nicotine or tobacco, such as cigarettes, e-cigarettes, and chewing tobacco. These can affect your test results. If you need help quitting, ask your health care provider. The testing procedure may vary among health care providers and hospitals. How are the results reported? Your results will be reported as milligrams of glucose per deciliter of blood (mg/dL) or millimoles per liter (mmol/L). There is more than one source for screening and diagnosis reference values used to diagnose gestational diabetes. Your health care provider will compare your results to normal values that were established after testing a large group of people (reference values). Reference values may vary among labs and hospitals. For this test, reference values are: Fasting: 92 mg/dL 1 hour: 180 mg/dL  2 hour: 153 mg/dL   What do the results mean? Results below the reference values are considered normal. If one or more of your blood glucose levels are at or above the reference values, you will be diagnosed with gestational diabetes.  Talk with your health care provider about what your results mean. Questions to ask your health  care provider Ask your health care provider, or the department that is doing the test: When will my results be ready? How will I get my results? What are my treatment options? What other tests do I need? What are my next steps? Summary The oral glucose tolerance test (GTT)  is one of several tests used to diagnose diabetes that develops during pregnancy (gestational diabetes mellitus). Gestational diabetes is a short-term form of diabetes that some women develop while they are pregnant. You may also have this test if you have any symptoms or risk factors for this type of diabetes. Talk with your health care provider about what your results mean. This information is not intended to replace advice given to you by your health care provider. Make sure you discuss any questions you have with your health care provider. TDaP Vaccine Pregnancy Get the Whooping Cough Vaccine While You Are Pregnant (CDC)  It is important for women to get the whooping cough vaccine in the third trimester of each pregnancy. Vaccines are the best way to prevent this disease. There are 2 different whooping cough vaccines. Both vaccines combine protection against whooping cough, tetanus and diphtheria, but they are for different age groups: Tdap: for everyone 11 years or older, including pregnant women  DTaP: for children 2 months through 27 years of age  You need the whooping cough vaccine during each of your pregnancies The recommended time to get the shot is during your 27th through 36th week of pregnancy, preferably during the earlier part of this time period. The Centers for Disease Control and Prevention (CDC) recommends that pregnant women receive the whooping cough vaccine for adolescents and adults (called Tdap vaccine) during the third trimester of each pregnancy. The recommended time to get the shot is during your 27th through 36th week of pregnancy, preferably during the earlier part of this time period. This replaces the original recommendation that pregnant women get the vaccine only if they had not previously received it. The [employer] of Obstetricians and Gynecologists and the [employer] support this recommendation.  You should get the whooping cough  vaccine while pregnant to pass protection to your baby frame support disabled and/or not supported in this browser  Learn why Mickel Baas decided to get the whooping cough vaccine in her 3rd trimester of pregnancy and how her baby girl was born with some protection against the disease. Also available on YouTube. After receiving the whooping cough vaccine, your body will create protective antibodies (proteins produced by the body to fight off diseases) and pass some of them to your baby before birth. These antibodies provide your baby some short-term protection against whooping cough in early life. These antibodies can also protect your baby from some of the more serious complications that come along with whooping cough. Your protective antibodies are at their highest about 2 weeks after getting the vaccine, but it takes time to pass them to your baby. So the preferred time to get the whooping cough vaccine is early in your third trimester. The amount of whooping cough antibodies in your body decreases over time. That is why CDC recommends you get a whooping cough vaccine during each pregnancy. Doing so allows each of your babies to get the greatest number of protective antibodies from you. This means each of your babies will get the best protection possible against this disease.  Getting the whooping cough vaccine while pregnant is better than getting the vaccine after you give birth Whooping cough vaccination  during pregnancy is ideal so your baby will have short-term protection as soon as he is born. This early protection is important because your baby will not start getting his whooping cough vaccines until he is 2 months old. These first few months of life are when your baby is at greatest risk for catching whooping cough. This is also when he's at greatest risk for having severe, potentially life-threating complications from the infection. To avoid that gap in protection, it is best to get a whooping cough  vaccine during pregnancy. You will then pass protection to your baby before he is born. To continue protecting your baby, he should get whooping cough vaccines starting at 2 months old. You may never have gotten the Tdap vaccine before and did not get it during this pregnancy. If so, you should make sure to get the vaccine immediately after you give birth, before leaving the hospital or birthing center. It will take about 2 weeks before your body develops protection (antibodies) in response to the vaccine. Once you have protection from the vaccine, you are less likely to give whooping cough to your newborn while caring for him. But remember, your baby will still be at risk for catching whooping cough from others. A recent study looked to see how effective Tdap was at preventing whooping cough in babies whose mothers got the vaccine while pregnant or in the hospital after giving birth. The study found that getting Tdap between 27 through 36 weeks of pregnancy is 85% more effective at preventing whooping cough in babies younger than 2 months old. Blood tests cannot tell if you need a whooping cough vaccine There are no blood tests that can tell you if you have enough antibodies in your body to protect yourself or your baby against whooping cough. Even if you have been sick with whooping cough in the past or previously received the vaccine, you still should get the vaccine during each pregnancy. Breastfeeding may pass some protective antibodies onto your baby By breastfeeding, you may pass some antibodies you have made in response to the vaccine to your baby. When you get a whooping cough vaccine during your pregnancy, you will have antibodies in your breast milk that you can share with your baby as soon as your milk comes in. However, your baby will not get protective antibodies immediately if you wait to get the whooping cough vaccine until after delivering your baby. This is because it takes about 2 weeks for  your body to create antibodies. Learn more about the health benefits of breastfeeding.

## 2022-12-18 NOTE — Progress Notes (Signed)
PRENATAL VISIT NOTE  Subjective:  Natalie Pierce is a 27 y.o. [id number] at 2w4dbeing seen today for ongoing prenatal care.  She is currently monitored for the following issues for this high-risk pregnancy and has History of IUFD at 363 weeks History of postpartum hypertension; and Supervision of high risk pregnancy in second trimester on their problem list.  Patient reports no complaints.  Contractions: Not present. Vag. Bleeding: None.  Movement: Present. Denies leaking of fluid.   The following portions of the patient's history were reviewed and updated as appropriate: allergies, current medications, past family history, past medical history, past social history, past surgical history and problem list.   Objective:   Vitals:   12/18/22 1435  BP: 129/81  Pulse: (!) 101  Weight: 202 lb (91.6 kg)    Fetal Status: Fetal Heart Rate (bpm): 142   Movement: Present     General:  Alert, oriented and cooperative. Patient is in no acute distress.  Skin: Skin is warm and dry. No rash noted.   Cardiovascular: Normal heart rate noted  Respiratory: Normal respiratory effort, no problems with respiration noted  Abdomen: Soft, gravid, appropriate for gestational age.  Pain/Pressure: Absent     Pelvic: Cervical exam deferred        Extremities: Normal range of motion.  Edema: None  Mental Status: Normal mood and affect. Normal behavior. Normal judgment and thought content.   Imaging: UKoreaMFM OB DETAIL +14 WK  Result Date: 11/29/2022 ----------------------------------------------------------------------  OBSTETRICS REPORT                       (Signed Final 11/29/2022 03:55 pm) ---------------------------------------------------------------------- Patient Info  ID #:       [id number]                         D.O.B.:  1Aug 07, 1997(26 yrs)  Name:       Natalie Pierce               Visit Date: 11/29/2022 02:13 pm ---------------------------------------------------------------------- Performed By   Attending:        RTama HighMD        Ref. Address:     [address]                                                            [place] Performed By:     EMilus Glazier      [facility]  Referred By:      Aletha Halim MD ---------------------------------------------------------------------- Orders  #  Description                           Code        Ordered By  1  [place] MFM OB DETAIL +14 WK               76811.01    CHARLIE PICKENS ----------------------------------------------------------------------  #  Order #                     Accession #                Episode #  1  [id number]                   [id number]                 [id number] ---------------------------------------------------------------------- Indications  Poor obstetric history: Previous IUFD          O09.299  (stillbirth)  [redacted] weeks gestation of pregnancy                Z3A.21  Encounter for antenatal screening for          Z36.3  malformations  Short interval between pregancies, 2nd         O09.892  trimester  LR NIPS/neg AFP ---------------------------------------------------------------------- Fetal Evaluation  Num Of Fetuses:         1  Fetal Heart Rate(bpm):  155  Cardiac Activity:       Observed  Presentation:           Cephalic  Placenta:               Anterior  P. Cord Insertion:      Visualized  Amniotic Fluid  AFI FV:      Within normal limits                              Largest Pocket(cm)                              7.03 ---------------------------------------------------------------------- Biometry  BPD:      50.3  mm     G. Age:  21w 2d         24  %    CI:        69.32   %    70 - 86                                                           FL/HC:      19.7   %  18.4 - 20.2  HC:      192.9  mm     G. Age:  21w 4d         25  %    HC/AC:      1.12        1.06 - 1.25  AC:      172.2  mm     G. Age:  22w 1d         53  %    FL/BPD:     75.5   %    71 - 87  FL:         38  mm     G. Age:  22w 1d         50  %    FL/AC:      22.1   %    20 - 24  HUM:      37.1  mm     G. Age:  23w 0d         77  %  CER:      23.4  mm     G. Age:  21w 5d         62  %  NFT:       2.9  mm  LV:        3.9  mm  CM:        4.4  mm  Est. FW:     473  gm      1 lb 1 oz     55  % ---------------------------------------------------------------------- OB History  Gravidity:    4         Term:   3        Prem:   0        SAB:   0  TOP:          0       Ectopic:  0        Living: 2 ---------------------------------------------------------------------- Gestational Age  LMP:           21w 6d        Date:  06/29/22                  EDD:   04/05/23  U/S Today:     21w 6d                                        EDD:   04/05/23  Best:          Audrea Muscat 6d     Det. By:  LMP  (06/29/22)          EDD:   04/05/23 ---------------------------------------------------------------------- Anatomy  Cranium:               Appears normal         LVOT:                   Appears normal  Cavum:                 Appears normal         Aortic Arch:            Appears normal  Ventricles:            Appears normal         Ductal Arch:  Appears normal  Choroid Plexus:        Appears normal         Diaphragm:              Appears normal  Cerebellum:            Appears normal         Stomach:                Appears normal, left                                                                        sided  Posterior Fossa:       Appears normal         Abdomen:                Appears normal  Nuchal Fold:           Appears normal         Abdominal Wall:         Appears nml (cord                                                                        insert, abd wall)  Face:                   Appears normal         Cord Vessels:           Appears normal (3                         (orbits and profile)                           vessel cord)  Lips:                  Appears normal         Kidneys:                Appear normal  Palate:                Not well visualized    Bladder:                Appears normal  Thoracic:              Appears normal         Spine:                  Appears normal  Heart:                 Appears normal         Upper Extremities:      Appears normal                         (4CH, axis, and  situs)  RVOT:                  Appears normal         Lower Extremities:      Appears normal  Other:  4ch, RVOT, LVOT, IVC/SVC visualized. C-spine, both hands/feet          visualized. ---------------------------------------------------------------------- Cervix Uterus Adnexa  Cervix  Length:            3.8  cm.  Normal appearance by transabdominal scan  Right Ovary  Within normal limits.  Left Ovary  Not visualized. ---------------------------------------------------------------------- Impression  G4 P3002.  Patient is here for fetal anatomy scan. On cell-  free fetal DNA screening, the risks of aneuploidies are not  increased. MSAFP screening showed low risk for open-neural  tube defects.  Obstetrical history significant for 3 term vaginal deliveries.  Her second pregnancy was complicated by term intrauterine  fetal death (cause not known).  We performed a fetal anatomy scan. No markers of  aneuploidies or fetal structural defects are seen. Fetal  biometry is consistent with her previously-established dates.  Amniotic fluid is normal and good fetal activity is seen.  Patient understands the limitations of ultrasound in detecting  fetal anomalies. ---------------------------------------------------------------------- Recommendations  -An appointment was made for her to return in 4 weeks for  fetal growth assessment.  -Fetal growth assessment every 4 weeks till  delivery.  -Weekly BPP from [redacted] weeks gestation till delivery. ----------------------------------------------------------------------                 Tama High, MD Electronically Signed Final Report   11/29/2022 03:55 pm ----------------------------------------------------------------------   Assessment and Plan:  Pregnancy: [id number] at 35w4d1. History of IUFD at 367weeks Serial growth scans and antenatal testing to start at 32 weeks as per MFM recommendations.  2. [redacted] weeks gestation of pregnancy 3. Supervision of high risk pregnancy in second trimester No other concerns. Preterm labor symptoms and general obstetric precautions including but not limited to vaginal bleeding, contractions, leaking of fluid and fetal movement were reviewed in detail with the patient. Please refer to After Visit Summary for other counseling recommendations.   Return in about 4 weeks (around 01/15/2023) for 2 hr GTT, 3rd trimester labs, TDap, OFFICE OB VISIT (MD only).  Future Appointments  Date Time Provider [place] 12/27/2022  [facility] 12/27/2022  2:30 PM [facility] [place]   UVerita Schneiders MD

## 2022-12-27 ENCOUNTER — Ambulatory Visit: Payer: BC Managed Care – PPO | Attending: Obstetrics and Gynecology

## 2022-12-27 ENCOUNTER — Ambulatory Visit: Payer: BC Managed Care – PPO | Admitting: *Deleted

## 2022-12-27 ENCOUNTER — Other Ambulatory Visit: Payer: Self-pay | Admitting: *Deleted

## 2022-12-27 VITALS — BP 117/82 | HR 90

## 2022-12-27 DIAGNOSIS — Z8759 Personal history of other complications of pregnancy, childbirth and the puerperium: Secondary | ICD-10-CM

## 2022-12-27 DIAGNOSIS — Z362 Encounter for other antenatal screening follow-up: Secondary | ICD-10-CM | POA: Diagnosis present

## 2022-12-27 DIAGNOSIS — O09892 Supervision of other high risk pregnancies, second trimester: Secondary | ICD-10-CM | POA: Diagnosis present

## 2022-12-27 DIAGNOSIS — Z3A25 25 weeks gestation of pregnancy: Secondary | ICD-10-CM | POA: Diagnosis not present

## 2022-12-27 DIAGNOSIS — O0992 Supervision of high risk pregnancy, unspecified, second trimester: Secondary | ICD-10-CM

## 2022-12-27 DIAGNOSIS — O09292 Supervision of pregnancy with other poor reproductive or obstetric history, second trimester: Secondary | ICD-10-CM

## 2022-12-27 DIAGNOSIS — O09899 Supervision of other high risk pregnancies, unspecified trimester: Secondary | ICD-10-CM

## 2023-01-17 ENCOUNTER — Ambulatory Visit (INDEPENDENT_AMBULATORY_CARE_PROVIDER_SITE_OTHER): Payer: BC Managed Care – PPO | Admitting: Family Medicine

## 2023-01-17 ENCOUNTER — Other Ambulatory Visit: Payer: BC Managed Care – PPO

## 2023-01-17 VITALS — BP 137/83 | HR 99 | Wt 202.0 lb

## 2023-01-17 DIAGNOSIS — Z3A28 28 weeks gestation of pregnancy: Secondary | ICD-10-CM

## 2023-01-17 DIAGNOSIS — Z8759 Personal history of other complications of pregnancy, childbirth and the puerperium: Secondary | ICD-10-CM

## 2023-01-17 DIAGNOSIS — O0992 Supervision of high risk pregnancy, unspecified, second trimester: Secondary | ICD-10-CM

## 2023-01-17 DIAGNOSIS — Z8679 Personal history of other diseases of the circulatory system: Secondary | ICD-10-CM

## 2023-01-17 NOTE — Progress Notes (Signed)
CC: Denies any concerns  

## 2023-01-17 NOTE — Progress Notes (Signed)
   PRENATAL VISIT NOTE  Subjective:  Natalie Pierce is a 27 y.o. [id number] at [redacted]w[redacted]d being seen today for ongoing prenatal care.  She is currently monitored for the following issues for this low-risk pregnancy and has History of IUFD at 41 weeks; History of postpartum hypertension; and Supervision of high risk pregnancy in second trimester on their problem list.  Patient reports no complaints.  Contractions: Not present. Vag. Bleeding: None.  Movement: Present. Denies leaking of fluid.   The following portions of the patient's history were reviewed and updated as appropriate: allergies, current medications, past family history, past medical history, past social history, past surgical history and problem list.   Objective:   Vitals:   01/17/23 0905  BP: 137/83  Pulse: 99  Weight: 202 lb (91.6 kg)    Fetal Status: Fetal Heart Rate (bpm): 153 Fundal Height: 26 cm Movement: Present     General:  Alert, oriented and cooperative. Patient is in no acute distress.  Skin: Skin is warm and dry. No rash noted.   Cardiovascular: Normal heart rate noted  Respiratory: Normal respiratory effort, no problems with respiration noted  Abdomen: Soft, gravid, appropriate for gestational age.  Pain/Pressure: Absent     Pelvic: Cervical exam deferred        Extremities: Normal range of motion.  Edema: None  Mental Status: Normal mood and affect. Normal behavior. Normal judgment and thought content.   Assessment and Plan:  Pregnancy: [id number] at [redacted]w[redacted]d 1. History of postpartum hypertension On ASA  2. History of IUFD at 55 weeks Serial u/s for growth, last growth WNL--has f/u next week  3. Supervision of high risk pregnancy in second trimester Continue prenatal care.   Preterm labor symptoms and general obstetric precautions including but not limited to vaginal bleeding, contractions, leaking of fluid and fetal movement were reviewed in detail with the patient. Please refer to After Visit Summary for  other counseling recommendations.   Return in 2 weeks (on 01/31/2023).  Future Appointments  Date Time Provider [place]  01/24/2023  2:45 PM [facility] NURSE [facility]  01/24/2023  3:00 PM [facility] US1 [facility]  01/31/2023  1:30 PM Anyanwu, Sallyanne Havers, MD [facility]  02/14/2023  1:30 PM Aletha Halim, MD [facility]  02/28/2023  1:30 PM Aletha Halim, MD [facility]  03/14/2023  1:30 PM Donnamae Jude, MD [facility]    Donnamae Jude, MD

## 2023-01-18 LAB — CBC
Hematocrit: 31.6 % — ABNORMAL LOW (ref 34.0–46.6)
Hemoglobin: 10.3 g/dL — ABNORMAL LOW (ref 11.1–15.9)
MCH: 28.1 pg (ref 26.6–33.0)
MCHC: 32.6 g/dL (ref 31.5–35.7)
MCV: 86 fL (ref 79–97)
Platelets: 270 10*3/uL (ref 150–450)
RBC: 3.66 x10E6/uL — ABNORMAL LOW (ref 3.77–5.28)
RDW: 14.8 % (ref 11.7–15.4)
WBC: 7 10*3/uL (ref 3.4–10.8)

## 2023-01-18 LAB — GLUCOSE TOLERANCE, 2 HOURS W/ 1HR
Glucose, 1 hour: 100 mg/dL (ref 70–179)
Glucose, 2 hour: 102 mg/dL (ref 70–152)
Glucose, Fasting: 78 mg/dL (ref 70–91)

## 2023-01-18 LAB — RPR: RPR Ser Ql: NONREACTIVE

## 2023-01-18 LAB — HIV ANTIBODY (ROUTINE TESTING W REFLEX): HIV Screen 4th Generation wRfx: NONREACTIVE

## 2023-01-24 ENCOUNTER — Ambulatory Visit: Payer: BC Managed Care – PPO | Attending: Obstetrics

## 2023-01-24 ENCOUNTER — Ambulatory Visit: Payer: BC Managed Care – PPO | Admitting: *Deleted

## 2023-01-24 VITALS — BP 117/73 | HR 88

## 2023-01-24 DIAGNOSIS — O0992 Supervision of high risk pregnancy, unspecified, second trimester: Secondary | ICD-10-CM

## 2023-01-24 DIAGNOSIS — Z8759 Personal history of other complications of pregnancy, childbirth and the puerperium: Secondary | ICD-10-CM | POA: Insufficient documentation

## 2023-01-24 DIAGNOSIS — O09899 Supervision of other high risk pregnancies, unspecified trimester: Secondary | ICD-10-CM | POA: Insufficient documentation

## 2023-01-24 DIAGNOSIS — Z3A29 29 weeks gestation of pregnancy: Secondary | ICD-10-CM

## 2023-01-24 DIAGNOSIS — O09293 Supervision of pregnancy with other poor reproductive or obstetric history, third trimester: Secondary | ICD-10-CM

## 2023-01-25 ENCOUNTER — Other Ambulatory Visit: Payer: Self-pay | Admitting: *Deleted

## 2023-01-25 DIAGNOSIS — O09899 Supervision of other high risk pregnancies, unspecified trimester: Secondary | ICD-10-CM

## 2023-01-25 DIAGNOSIS — Z8759 Personal history of other complications of pregnancy, childbirth and the puerperium: Secondary | ICD-10-CM

## 2023-01-31 ENCOUNTER — Telehealth (INDEPENDENT_AMBULATORY_CARE_PROVIDER_SITE_OTHER): Payer: BC Managed Care – PPO | Admitting: Obstetrics & Gynecology

## 2023-01-31 ENCOUNTER — Encounter: Payer: Self-pay | Admitting: Obstetrics & Gynecology

## 2023-01-31 DIAGNOSIS — Z8759 Personal history of other complications of pregnancy, childbirth and the puerperium: Secondary | ICD-10-CM

## 2023-01-31 DIAGNOSIS — O0993 Supervision of high risk pregnancy, unspecified, third trimester: Secondary | ICD-10-CM

## 2023-01-31 DIAGNOSIS — O09293 Supervision of pregnancy with other poor reproductive or obstetric history, third trimester: Secondary | ICD-10-CM

## 2023-01-31 DIAGNOSIS — O0992 Supervision of high risk pregnancy, unspecified, second trimester: Secondary | ICD-10-CM

## 2023-01-31 DIAGNOSIS — Z3A30 30 weeks gestation of pregnancy: Secondary | ICD-10-CM

## 2023-01-31 NOTE — Progress Notes (Signed)
OBSTETRICS PRENATAL VIRTUAL VISIT ENCOUNTER NOTE  Provider location: [facility]   Patient location: Work  I connected with Sherie Don on 01/31/23 at  1:30 PM EDT by MyChart Video Encounter and verified that I am speaking with the correct person using two identifiers. I discussed the limitations, risks, security and privacy concerns of performing an evaluation and management service virtually and the availability of in person appointments. I also discussed with the patient that there may be a patient responsible charge related to this service. The patient expressed understanding and agreed to proceed. Subjective:  Natalie Pierce is a 27 y.o. [id number] at [redacted]w[redacted]d being seen today for ongoing prenatal care.  She is currently monitored for the following issues for this high-risk pregnancy and has History of IUFD at 39 weeks; History of postpartum hypertension; and Supervision of high risk pregnancy in second trimester on their problem list.  Patient reports no complaints.  Contractions: Not present. Vag. Bleeding: None.  Movement: Present. Denies any leaking of fluid.   The following portions of the patient's history were reviewed and updated as appropriate: allergies, current medications, past family history, past medical history, past social history, past surgical history and problem list.   Objective:  There were no vitals filed for this visit.  Fetal Status:     Movement: Present     General:  Alert, oriented and cooperative. Patient is in no acute distress.  Respiratory: Normal respiratory effort, no problems with respiration noted  Mental Status: Normal mood and affect. Normal behavior. Normal judgment and thought content.  Rest of physical exam deferred due to type of encounter  Imaging: [place] MFM OB FOLLOW UP  Result Date: 01/25/2023 ----------------------------------------------------------------------  OBSTETRICS REPORT                        (Signed Final 01/25/2023 09:47 am) ---------------------------------------------------------------------- Patient Info  ID #:       [id number]                          D.O.B.:  April 04, 1996 (26 yrs)  Name:       Natalie Pierce                Visit Date: 01/24/2023 02:53 pm ---------------------------------------------------------------------- Performed By  Attending:        Noralee Space MD        Ref. Address:     [address]                                                             [place], [place]                                                             [address]  Performed By:     Earley Brooke     [facility], [facility]                                 [facility] at                                                             [facility]  Referred By:      [place] Bing MD ---------------------------------------------------------------------- Orders  #  Description                           Code        Ordered By  1  [place] MFM OB FOLLOW UP                   16109.60    YU FANG ----------------------------------------------------------------------  #  Order #                     Accession #                Episode #  1  [id number]                   [id number]                 [id number] ---------------------------------------------------------------------- Indications  [redacted] weeks gestation of pregnancy                Z3A.29  Poor obstetric history: Previous IUFD          O09.299  (stillbirth)  Short interval between pregancies, 2nd         O09.892  trimester  Encounter for other antenatal screening        Z36.2  follow-up  LR NIPS/neg AFP ---------------------------------------------------------------------- Fetal Evaluation  Num Of Fetuses:         1  Fetal Heart Rate(bpm):  151  Cardiac Activity:       Observed  Presentation:           Breech  Placenta:               Anterior  P. Cord  Insertion:      Previously visualized  Amniotic Fluid  AFI FV:      Within normal limits  AFI Sum(cm)     %Tile       Largest Pocket(cm)  20.83           82          6.14  RUQ(cm)       RLQ(cm)       LUQ(cm)        LLQ(cm)  3.97          4.82  5.9            6.14 ---------------------------------------------------------------------- Biometry  BPD:      71.1  mm     G. Age:  28w 4d          8  %    CI:        74.98   %    70 - 86                                                          FL/HC:      22.0   %    19.2 - 21.4  HC:      260.5  mm     G. Age:  28w 2d        1.2  %    HC/AC:      1.04        0.99 - 1.21  AC:      251.3  mm     G. Age:  29w 2d         29  %    FL/BPD:     80.5   %    71 - 87  FL:       57.2  mm     G. Age:  30w 0d         38  %    FL/AC:      22.8   %    20 - 24  HUM:      51.9  mm     G. Age:  30w 2d         60  %  LV:        4.8  mm  Est. FW:    1384  gm      3 lb 1 oz     22  % ---------------------------------------------------------------------- OB History  Gravidity:    4         Term:   3        Prem:   0        SAB:   0  TOP:          0       Ectopic:  0        Living: 2 ---------------------------------------------------------------------- Gestational Age  LMP:           29w 6d        Date:  06/29/22                  EDD:   04/05/23  U/S Today:     29w 0d                                        EDD:   04/11/23  Best:          29w 6d     Det. By:  LMP  (06/29/22)          EDD:   04/05/23 ---------------------------------------------------------------------- Anatomy  Cranium:               Appears normal         LVOT:  Previously seen  Cavum:                 Previously seen        Aortic Arch:            Previously seen  Ventricles:            Appears normal         Ductal Arch:            Previously seen  Choroid Plexus:        Previously seen        Diaphragm:              Appears normal  Cerebellum:            Previously seen        Stomach:                 Appears normal, left                                                                        sided  Posterior Fossa:       Previously seen        Abdomen:                Previously seen  Nuchal Fold:           Previously seen        Abdominal Wall:         Previously seen  Face:                  Orbits and profile     Cord Vessels:           Previously seen                         previously seen  Lips:                  Previously seen        Kidneys:                Appear normal  Palate:                Not well visualized    Bladder:                Appears normal  Thoracic:              Previously seen        Spine:                  Previously seen  Heart:                 Previously seen        Upper Extremities:      Previously seen  RVOT:                  Previously seen        Lower Extremities:      Previously seen  Other:  SVC/IVC previously seen. ---------------------------------------------------------------------- Cervix Uterus Adnexa  Cervix  Not visualized (advanced GA >24wks) ---------------------------------------------------------------------- Impression  History of fetal demise at term.  She  does not have gestational diabetes.  Fetal growth is appropriate for gestational age.  Amniotic fluid  is normal and good fetal activity seen.  We reassured the patient of the findings. ---------------------------------------------------------------------- Recommendations  -An appointment was made for her to return in 4 weeks for  fetal growth assessment and BPP. ----------------------------------------------------------------------                 Noralee Space, MD Electronically Signed Final Report   01/25/2023 09:47 am ----------------------------------------------------------------------   Assessment and Plan:  Pregnancy: [id number] at [redacted]w[redacted]d 1. History of IUFD at 39 weeks 2. [redacted] weeks gestation of pregnancy 3. Supervision of high risk pregnancy in second trimester No concerns. Continue scheduled antenatal  testing and scans as per MFM. Preterm labor symptoms and general obstetric precautions including but not limited to vaginal bleeding, contractions, leaking of fluid and fetal movement were reviewed in detail with the patient. I discussed the assessment and treatment plan with the patient. The patient was provided an opportunity to ask questions and all were answered. The patient agreed with the plan and demonstrated an understanding of the instructions. The patient was advised to call back or seek an in-person office evaluation/go to [facility] at [facility] for any urgent or concerning symptoms. Please refer to After Visit Summary for other counseling recommendations.   I provided 10 minutes of face-to-face time during this encounter.  Return in about 2 weeks (around 02/14/2023) for OB visits and antenatal testing as scheduled.  Future Appointments  Date Time Provider Department Center  02/14/2023  2:30 PM [place] Bing, MD [facility]  02/22/2023  1:45 PM [facility] NURSE [facility]  02/22/2023  2:00 PM [facility] US1 [facility]  02/28/2023  1:30 PM [place] Bing, MD [facility]  02/28/2023  2:45 PM [facility] NURSE [facility]  02/28/2023  3:00 PM [facility] US1 [facility]  03/07/2023  2:30 PM [facility] NURSE [facility]  03/07/2023  2:45 PM [facility] US5 [facility]  03/14/2023  2:30 PM Reva Bores, MD [facility]    Jaynie Collins, MD Center for [facility], [facility]

## 2023-02-14 ENCOUNTER — Encounter: Payer: BC Managed Care – PPO | Admitting: Obstetrics and Gynecology

## 2023-02-16 ENCOUNTER — Ambulatory Visit: Payer: BC Managed Care – PPO | Admitting: *Deleted

## 2023-02-16 ENCOUNTER — Ambulatory Visit: Payer: BC Managed Care – PPO | Attending: Obstetrics and Gynecology

## 2023-02-16 VITALS — BP 124/71 | HR 94

## 2023-02-16 DIAGNOSIS — O0992 Supervision of high risk pregnancy, unspecified, second trimester: Secondary | ICD-10-CM | POA: Diagnosis present

## 2023-02-16 DIAGNOSIS — O09293 Supervision of pregnancy with other poor reproductive or obstetric history, third trimester: Secondary | ICD-10-CM

## 2023-02-16 DIAGNOSIS — Z8759 Personal history of other complications of pregnancy, childbirth and the puerperium: Secondary | ICD-10-CM | POA: Diagnosis present

## 2023-02-16 DIAGNOSIS — Z3A33 33 weeks gestation of pregnancy: Secondary | ICD-10-CM

## 2023-02-16 DIAGNOSIS — O09899 Supervision of other high risk pregnancies, unspecified trimester: Secondary | ICD-10-CM | POA: Diagnosis present

## 2023-02-22 ENCOUNTER — Ambulatory Visit: Payer: BC Managed Care – PPO | Admitting: *Deleted

## 2023-02-22 ENCOUNTER — Ambulatory Visit: Payer: BC Managed Care – PPO | Attending: Obstetrics and Gynecology

## 2023-02-22 VITALS — BP 123/77 | HR 95

## 2023-02-22 DIAGNOSIS — O09293 Supervision of pregnancy with other poor reproductive or obstetric history, third trimester: Secondary | ICD-10-CM

## 2023-02-22 DIAGNOSIS — Z8759 Personal history of other complications of pregnancy, childbirth and the puerperium: Secondary | ICD-10-CM | POA: Insufficient documentation

## 2023-02-22 DIAGNOSIS — O0992 Supervision of high risk pregnancy, unspecified, second trimester: Secondary | ICD-10-CM

## 2023-02-22 DIAGNOSIS — O09899 Supervision of other high risk pregnancies, unspecified trimester: Secondary | ICD-10-CM | POA: Diagnosis present

## 2023-02-22 DIAGNOSIS — Z3A34 34 weeks gestation of pregnancy: Secondary | ICD-10-CM

## 2023-02-28 ENCOUNTER — Ambulatory Visit: Payer: BC Managed Care – PPO

## 2023-02-28 ENCOUNTER — Ambulatory Visit: Payer: BC Managed Care – PPO | Attending: Obstetrics and Gynecology

## 2023-02-28 ENCOUNTER — Telehealth (INDEPENDENT_AMBULATORY_CARE_PROVIDER_SITE_OTHER): Payer: BC Managed Care – PPO | Admitting: Obstetrics and Gynecology

## 2023-02-28 VITALS — BP 129/80 | HR 91

## 2023-02-28 DIAGNOSIS — Z8759 Personal history of other complications of pregnancy, childbirth and the puerperium: Secondary | ICD-10-CM | POA: Insufficient documentation

## 2023-02-28 DIAGNOSIS — O09293 Supervision of pregnancy with other poor reproductive or obstetric history, third trimester: Secondary | ICD-10-CM | POA: Diagnosis not present

## 2023-02-28 DIAGNOSIS — O0992 Supervision of high risk pregnancy, unspecified, second trimester: Secondary | ICD-10-CM

## 2023-02-28 DIAGNOSIS — Z8679 Personal history of other diseases of the circulatory system: Secondary | ICD-10-CM

## 2023-02-28 DIAGNOSIS — Z3A39 39 weeks gestation of pregnancy: Secondary | ICD-10-CM

## 2023-02-28 DIAGNOSIS — O403XX0 Polyhydramnios, third trimester, not applicable or unspecified: Secondary | ICD-10-CM | POA: Insufficient documentation

## 2023-02-28 DIAGNOSIS — O09899 Supervision of other high risk pregnancies, unspecified trimester: Secondary | ICD-10-CM | POA: Insufficient documentation

## 2023-02-28 DIAGNOSIS — Z3A34 34 weeks gestation of pregnancy: Secondary | ICD-10-CM

## 2023-02-28 NOTE — Progress Notes (Signed)
    TELEHEALTH OBSTETRICS VISIT ENCOUNTER NOTE  Provider location: Center for [facility]   Patient location: Home  I connected with Natalie Pierce on 02/28/23 at  1:30 PM EDT by telephone at home and verified that I am speaking with the correct person using two identifiers. Of note, unable to do video encounter due to technical difficulties.    I discussed the limitations, risks, security and privacy concerns of performing an evaluation and management service by telephone and the availability of in person appointments. I also discussed with the patient that there may be a patient responsible charge related to this service. The patient expressed understanding and agreed to proceed.  Subjective:  Natalie Pierce is a 27 y.o. [id number] at [redacted]w[redacted]d being followed for ongoing prenatal care.  She is currently monitored for the following issues for this high-risk pregnancy and has History of IUFD at 39 weeks; History of postpartum hypertension; and Supervision of high risk pregnancy in second trimester on their problem list.  Patient reports no complaints. Reports fetal movement. Denies any contractions, bleeding or leaking of fluid.   The following portions of the patient's history were reviewed and updated as appropriate: allergies, current medications, past family history, past medical history, past social history, past surgical history and problem list.   Objective:  Last menstrual period 06/29/2022, currently breastfeeding. General:  Alert, oriented and cooperative.   Mental Status: Normal mood and affect perceived. Normal judgment and thought content.  Rest of physical exam deferred due to type of encounter  Assessment and Plan:  Pregnancy: [id number] at [redacted]w[redacted]d 1. Supervision of high risk pregnancy in second trimester GBS next visit. Follow up re: birth control next visit  2. History of IUFD at 39 weeks I d/w her that okay for delivery at 38-39wks given her history; pt  prefers 6/15. Can schedule next visit F/u bpp for today 5/9: cephalic, efw 25%, 2170gm, ac 69%, bpp 8/8, afi 25.03  3. History of postpartum hypertension Continue low dose asa  4. Poly Slight poly last u/s. S/p normal GTT. F/u today  Preterm labor symptoms and general obstetric precautions including but not limited to vaginal bleeding, contractions, leaking of fluid and fetal movement were reviewed in detail with the patient.  I discussed the assessment and treatment plan with the patient. The patient was provided an opportunity to ask questions and all were answered. The patient agreed with the plan and demonstrated an understanding of the instructions. The patient was advised to call back or seek an in-person office evaluation/go to [facility] at [facility] for any urgent or concerning symptoms. Please refer to After Visit Summary for other counseling recommendations.   I provided 7 minutes of non-face-to-face time during this encounter.  No follow-ups on file.  Future Appointments  Date Time Provider Department Center  02/28/2023  2:45 PM [facility] NURSE [facility]  02/28/2023  3:00 PM [facility] US1 [facility]  03/07/2023  2:30 PM [facility] NURSE [facility]  03/07/2023  2:45 PM [facility] US5 [facility]  03/14/2023  2:30 PM Reva Bores, MD [facility]  03/21/2023  2:30 PM Reva Bores, MD [facility]  03/28/2023  2:30 PM [place] Bing, MD [facility]  04/04/2023  2:30 PM Reva Bores, MD [facility]  04/11/2023  2:30 PM Reva Bores, MD [facility]    [place] Bing, MD Center for [facility], [facility]

## 2023-03-01 ENCOUNTER — Other Ambulatory Visit: Payer: Self-pay

## 2023-03-01 DIAGNOSIS — Z8759 Personal history of other complications of pregnancy, childbirth and the puerperium: Secondary | ICD-10-CM

## 2023-03-01 DIAGNOSIS — O09293 Supervision of pregnancy with other poor reproductive or obstetric history, third trimester: Secondary | ICD-10-CM

## 2023-03-07 ENCOUNTER — Ambulatory Visit: Payer: BC Managed Care – PPO | Admitting: *Deleted

## 2023-03-07 ENCOUNTER — Ambulatory Visit: Payer: BC Managed Care – PPO | Attending: Obstetrics and Gynecology

## 2023-03-07 VITALS — BP 133/69 | HR 80

## 2023-03-07 DIAGNOSIS — O09293 Supervision of pregnancy with other poor reproductive or obstetric history, third trimester: Secondary | ICD-10-CM

## 2023-03-07 DIAGNOSIS — O0992 Supervision of high risk pregnancy, unspecified, second trimester: Secondary | ICD-10-CM | POA: Diagnosis present

## 2023-03-07 DIAGNOSIS — O09899 Supervision of other high risk pregnancies, unspecified trimester: Secondary | ICD-10-CM | POA: Insufficient documentation

## 2023-03-07 DIAGNOSIS — Z3A35 35 weeks gestation of pregnancy: Secondary | ICD-10-CM | POA: Diagnosis not present

## 2023-03-07 DIAGNOSIS — Z8759 Personal history of other complications of pregnancy, childbirth and the puerperium: Secondary | ICD-10-CM | POA: Diagnosis present

## 2023-03-14 ENCOUNTER — Ambulatory Visit (INDEPENDENT_AMBULATORY_CARE_PROVIDER_SITE_OTHER): Payer: BC Managed Care – PPO | Admitting: Family Medicine

## 2023-03-14 ENCOUNTER — Ambulatory Visit: Payer: BC Managed Care – PPO | Admitting: *Deleted

## 2023-03-14 ENCOUNTER — Encounter: Payer: BC Managed Care – PPO | Admitting: Family Medicine

## 2023-03-14 ENCOUNTER — Other Ambulatory Visit
Admission: RE | Admit: 2023-03-14 | Discharge: 2023-03-14 | Disposition: A | Discharge status: Home / Self Care | Payer: BC Managed Care – PPO | Source: Ambulatory Visit | Attending: Family Medicine | Admitting: Family Medicine

## 2023-03-14 VITALS — BP 128/86 | HR 98 | Wt 207.0 lb

## 2023-03-14 VITALS — BP 126/74 | HR 93

## 2023-03-14 DIAGNOSIS — Z8759 Personal history of other complications of pregnancy, childbirth and the puerperium: Secondary | ICD-10-CM

## 2023-03-14 DIAGNOSIS — O09893 Supervision of other high risk pregnancies, third trimester: Secondary | ICD-10-CM

## 2023-03-14 DIAGNOSIS — O0992 Supervision of high risk pregnancy, unspecified, second trimester: Secondary | ICD-10-CM | POA: Insufficient documentation

## 2023-03-14 DIAGNOSIS — O403XX0 Polyhydramnios, third trimester, not applicable or unspecified: Secondary | ICD-10-CM

## 2023-03-14 NOTE — Progress Notes (Signed)
    PRENATAL VISIT NOTE  Subjective:  Natalie Pierce is a 27 y.o. [id number] at [redacted]w[redacted]d being seen today for ongoing prenatal care.  She is currently monitored for the following issues for this high-risk pregnancy and has History of IUFD at 39 weeks; History of postpartum hypertension; Supervision of high risk pregnancy in second trimester; and Polyhydramnios affecting pregnancy in third trimester on their problem list.  Patient reports no complaints.  Contractions: Irregular. Vag. Bleeding: None.  Movement: Present. Denies leaking of fluid.   The following portions of the patient's history were reviewed and updated as appropriate: allergies, current medications, past family history, past medical history, past social history, past surgical history and problem list.   Objective:   Vitals:   03/14/23 1430  BP: 128/86  Pulse: 98  Weight: 207 lb (93.9 kg)    Fetal Status: Fetal Heart Rate (bpm): 129 Fundal Height: 35 cm Movement: Present  Presentation: Vertex  General:  Alert, oriented and cooperative. Patient is in no acute distress.  Skin: Skin is warm and dry. No rash noted.   Cardiovascular: Normal heart rate noted  Respiratory: Normal respiratory effort, no problems with respiration noted  Abdomen: Soft, gravid, appropriate for gestational age.  Pain/Pressure: Absent     Pelvic: Cervical exam deferred        Extremities: Normal range of motion.  Edema: None  Mental Status: Normal mood and affect. Normal behavior. Normal judgment and thought content.   Assessment and Plan:  Pregnancy: A5W0981 at [redacted]w[redacted]d 1. Polyhydramnios affecting pregnancy in third trimester In antenatal testing.  2. Supervision of high risk pregnancy in second trimester Cultures today - Cervicovaginal ancillary only - Strep Gp B NAA  3. History of IUFD at 39 weeks IOL @ 39 weeks Normal growth -  25% In antenatal testing.  Preterm labor symptoms and general obstetric precautions including but not limited to  vaginal bleeding, contractions, leaking of fluid and fetal movement were reviewed in detail with the patient. Please refer to After Visit Summary for other counseling recommendations.   Return in 1 week (on 03/21/2023).  Future Appointments  Date Time Provider Department Center  03/20/2023  9:15 AM [facility] NURSE [facility]  03/20/2023  9:30 AM [facility] US3 [facility]  03/21/2023  2:30 PM Reva Bores, MD [facility]  03/28/2023  1:00 PM [facility] NURSE [facility]  03/28/2023  1:15 PM [facility] NST [facility]  03/28/2023  2:30 PM [place] Bing, MD [facility]  03/31/2023 12:00 AM MC-LD SCHED ROOM [facility]IN[facility] None  04/04/2023  1:00 PM [facility] NURSE [facility]  04/04/2023  1:15 PM [facility] NST [facility]  04/04/2023  2:30 PM Reva Bores, MD [facility]  04/11/2023  2:30 PM Reva Bores, MD [facility]    Reva Bores, MD

## 2023-03-14 NOTE — Progress Notes (Signed)
CC: ROB denies any concerns   

## 2023-03-14 NOTE — Procedures (Signed)
Natalie Pierce 10/31/1995 [redacted]w[redacted]d  Fetus A Non-Stress Test Interpretation for 03/14/23  Indication:  his IUFD @39  wks  Fetal Heart Rate A Mode: External Baseline Rate (A): 120 bpm Variability: Moderate Accelerations: 15 x 15 Decelerations: None Multiple birth?: No  Uterine Activity Mode: Toco Contraction Frequency (min): 1 u/c during NST Contraction Duration (sec): 60 Contraction Quality: Mild Resting Tone Palpated: Relaxed  Interpretation (Fetal Testing) Nonstress Test Interpretation: Reactive Overall Impression: Reassuring for gestational age Comments: Tracing reviewed byDr. Grace Bushy

## 2023-03-16 LAB — STREP GP B NAA: Strep Gp B NAA: NEGATIVE

## 2023-03-16 LAB — CERVICOVAGINAL ANCILLARY ONLY
Chlamydia: NEGATIVE
Comment: NEGATIVE
Comment: NORMAL
Neisseria Gonorrhea: NEGATIVE

## 2023-03-20 ENCOUNTER — Ambulatory Visit: Payer: BC Managed Care – PPO | Admitting: *Deleted

## 2023-03-20 ENCOUNTER — Ambulatory Visit: Payer: BC Managed Care – PPO | Attending: Maternal & Fetal Medicine

## 2023-03-20 VITALS — BP 131/76 | HR 97

## 2023-03-20 DIAGNOSIS — E669 Obesity, unspecified: Secondary | ICD-10-CM | POA: Diagnosis not present

## 2023-03-20 DIAGNOSIS — Z8759 Personal history of other complications of pregnancy, childbirth and the puerperium: Secondary | ICD-10-CM

## 2023-03-20 DIAGNOSIS — O99213 Obesity complicating pregnancy, third trimester: Secondary | ICD-10-CM | POA: Diagnosis not present

## 2023-03-20 DIAGNOSIS — O0992 Supervision of high risk pregnancy, unspecified, second trimester: Secondary | ICD-10-CM

## 2023-03-20 DIAGNOSIS — O403XX0 Polyhydramnios, third trimester, not applicable or unspecified: Secondary | ICD-10-CM | POA: Diagnosis not present

## 2023-03-20 DIAGNOSIS — O09293 Supervision of pregnancy with other poor reproductive or obstetric history, third trimester: Secondary | ICD-10-CM | POA: Diagnosis not present

## 2023-03-20 DIAGNOSIS — Z3A37 37 weeks gestation of pregnancy: Secondary | ICD-10-CM

## 2023-03-21 ENCOUNTER — Ambulatory Visit (INDEPENDENT_AMBULATORY_CARE_PROVIDER_SITE_OTHER): Payer: BC Managed Care – PPO | Admitting: Family Medicine

## 2023-03-21 ENCOUNTER — Encounter: Payer: BC Managed Care – PPO | Admitting: Family Medicine

## 2023-03-21 VITALS — BP 125/82 | HR 76 | Wt 207.0 lb

## 2023-03-21 DIAGNOSIS — O0992 Supervision of high risk pregnancy, unspecified, second trimester: Secondary | ICD-10-CM

## 2023-03-21 DIAGNOSIS — O403XX0 Polyhydramnios, third trimester, not applicable or unspecified: Secondary | ICD-10-CM

## 2023-03-21 DIAGNOSIS — Z8759 Personal history of other complications of pregnancy, childbirth and the puerperium: Secondary | ICD-10-CM

## 2023-03-21 DIAGNOSIS — Z8679 Personal history of other diseases of the circulatory system: Secondary | ICD-10-CM

## 2023-03-21 NOTE — Progress Notes (Signed)
CC: Denies any concerns  

## 2023-03-21 NOTE — Progress Notes (Signed)
   PRENATAL VISIT NOTE  Subjective:  Natalie Pierce is a 26 y.o. [id number] at [redacted]w[redacted]d being seen today for ongoing prenatal care.  She is currently monitored for the following issues for this high-risk pregnancy and has History of IUFD at 39 weeks; History of postpartum hypertension; Supervision of high risk pregnancy in second trimester; and Polyhydramnios affecting pregnancy in third trimester on their problem list.  Patient reports no complaints.  Contractions: Not present. Vag. Bleeding: None.  Movement: Present. Denies leaking of fluid.   The following portions of the patient's history were reviewed and updated as appropriate: allergies, current medications, past family history, past medical history, past social history, past surgical history and problem list.   Objective:   Vitals:   03/21/23 1503  BP: 125/82  Pulse: 76  Weight: 207 lb (93.9 kg)    Fetal Status: Fetal Heart Rate (bpm): 146 Fundal Height: 37 cm Movement: Present  Presentation: Vertex  General:  Alert, oriented and cooperative. Patient is in no acute distress.  Skin: Skin is warm and dry. No rash noted.   Cardiovascular: Normal heart rate noted  Respiratory: Normal respiratory effort, no problems with respiration noted  Abdomen: Soft, gravid, appropriate for gestational age.  Pain/Pressure: Absent     Pelvic: Cervical exam deferred        Extremities: Normal range of motion.  Edema: None  Mental Status: Normal mood and affect. Normal behavior. Normal judgment and thought content.   Assessment and Plan:  Pregnancy: A5W0981 at [redacted]w[redacted]d 1. Supervision of high risk pregnancy in third trimester Continue prenatal care.  2. Polyhydramnios affecting pregnancy in third trimester In testing, saw MFM this week  3. History of IUFD at 39 weeks In testing, for IOL scheduled at 39 weeks  4. History of postpartum hypertension BP is ok for now. On ASA  Term labor symptoms and general obstetric precautions including but not  limited to vaginal bleeding, contractions, leaking of fluid and fetal movement were reviewed in detail with the patient. Please refer to After Visit Summary for other counseling recommendations.   Return in 1 week (on 03/28/2023).  Future Appointments  Date Time Provider Department Center  03/28/2023  1:00 PM [facility] NURSE [facility]  03/28/2023  1:15 PM [facility] NST [facility]  03/28/2023  2:30 PM [place] Bing, MD [facility]  03/31/2023 12:00 AM [facility] SCHED ROOM [facility] None  04/04/2023  1:00 PM [facility] NURSE [facility]  04/04/2023  1:15 PM [facility] NST [facility]    Reva Bores, MD

## 2023-03-28 ENCOUNTER — Encounter: Payer: BC Managed Care – PPO | Admitting: Obstetrics and Gynecology

## 2023-03-28 ENCOUNTER — Ambulatory Visit: Payer: BC Managed Care – PPO | Admitting: *Deleted

## 2023-03-28 ENCOUNTER — Ambulatory Visit (INDEPENDENT_AMBULATORY_CARE_PROVIDER_SITE_OTHER): Payer: BC Managed Care – PPO | Admitting: Obstetrics and Gynecology

## 2023-03-28 ENCOUNTER — Ambulatory Visit (HOSPITAL_BASED_OUTPATIENT_CLINIC_OR_DEPARTMENT_OTHER): Payer: BC Managed Care – PPO | Admitting: *Deleted

## 2023-03-28 VITALS — BP 129/86 | HR 96 | Wt 209.0 lb

## 2023-03-28 VITALS — BP 131/85 | HR 79

## 2023-03-28 DIAGNOSIS — Z8679 Personal history of other diseases of the circulatory system: Secondary | ICD-10-CM

## 2023-03-28 DIAGNOSIS — Z3A38 38 weeks gestation of pregnancy: Secondary | ICD-10-CM | POA: Diagnosis not present

## 2023-03-28 DIAGNOSIS — Z3A39 39 weeks gestation of pregnancy: Secondary | ICD-10-CM | POA: Insufficient documentation

## 2023-03-28 DIAGNOSIS — Z8759 Personal history of other complications of pregnancy, childbirth and the puerperium: Secondary | ICD-10-CM

## 2023-03-28 DIAGNOSIS — O0992 Supervision of high risk pregnancy, unspecified, second trimester: Secondary | ICD-10-CM

## 2023-03-28 NOTE — Progress Notes (Signed)
   PRENATAL VISIT NOTE  Subjective:  Natalie Pierce is a 27 y.o. [id number] at [redacted]w[redacted]d being seen today for ongoing prenatal care.  She is currently monitored for the following issues for this high-risk pregnancy and has History of IUFD at 39 weeks; History of postpartum hypertension; Supervision of high risk pregnancy in second trimester; and Polyhydramnios affecting pregnancy in third trimester on their problem list.  Patient reports no complaints.  Contractions: Not present. Vag. Bleeding: None.  Movement: Present. Denies leaking of fluid.   The following portions of the patient's history were reviewed and updated as appropriate: allergies, current medications, past family history, past medical history, past social history, past surgical history and problem list.   Objective:   Vitals:   03/28/23 1431  BP: 129/86  Pulse: 96  Weight: 209 lb (94.8 kg)    Fetal Status: Fetal Heart Rate (bpm): 140   Movement: Present  Presentation: Vertex  General:  Alert, oriented and cooperative. Patient is in no acute distress.  Skin: Skin is warm and dry. No rash noted.   Cardiovascular: Normal heart rate noted  Respiratory: Normal respiratory effort, no problems with respiration noted  Abdomen: Soft, gravid, appropriate for gestational age.  Pain/Pressure: Absent     Pelvic: Cervical exam deferred        Extremities: Normal range of motion.  Edema: None  Mental Status: Normal mood and affect. Normal behavior. Normal judgment and thought content.   Assessment and Plan:  Pregnancy: [id number] at [redacted]w[redacted]d 1. History of IUFD at 39 weeks Qwk testing with mfm reassuring today. Pt set up for 6/14 at 1130pm IOL, process d/w them 6/4: 44%, 3113gm, ac 48%  2. [redacted] weeks gestation of pregnancy GBS neg  3. History of postpartum hypertension Recommend pp lasix course and 1wk pp bp check   Term labor symptoms and general obstetric precautions including but not limited to vaginal bleeding, contractions, leaking  of fluid and fetal movement were reviewed in detail with the patient. Please refer to After Visit Summary for other counseling recommendations.   Return if symptoms worsen or fail to improve.  Future Appointments  Date Time Provider Department Center  03/31/2023 12:00 AM [facility]-LD SCHED ROOM [facility]-INDC None  04/04/2023  1:00 PM [facility] NURSE [facility]  04/04/2023  1:15 PM [facility] NST [facility]    [place] Bing, MD

## 2023-03-28 NOTE — Progress Notes (Signed)
CC:

## 2023-03-28 NOTE — Procedures (Signed)
Natalie Pierce 04-08-1996 106w6d  Fetus A Non-Stress Test Interpretation for 03/28/23  Indication:  prior IUFD  @39  weeks  Fetal Heart Rate A Mode: External Baseline Rate (A): 125 bpm Variability: Moderate Accelerations: 15 x 15 Decelerations: None Multiple birth?: No  Uterine Activity Mode: Toco Contraction Frequency (min): 9-11 Contraction Duration (sec): 60-100 Contraction Quality: Mild Resting Tone Palpated: Relaxed Resting Time: Adequate  Interpretation (Fetal Testing) Nonstress Test Interpretation: Reactive Overall Impression: Reassuring for gestational age Comments: Tracing reviewed byDr. Judeth Cornfield

## 2023-03-28 NOTE — Progress Notes (Signed)
CC: pain lower abdomen, depending on how she is sitting per patient

## 2023-03-29 ENCOUNTER — Telehealth (HOSPITAL_COMMUNITY): Payer: Self-pay | Admitting: *Deleted

## 2023-03-29 NOTE — Telephone Encounter (Signed)
Preadmission screen  

## 2023-03-31 ENCOUNTER — Encounter (HOSPITAL_COMMUNITY): Payer: Self-pay | Admitting: Family Medicine

## 2023-03-31 ENCOUNTER — Other Ambulatory Visit: Payer: Self-pay

## 2023-03-31 ENCOUNTER — Inpatient Hospital Stay (HOSPITAL_COMMUNITY)
Admission: RE | Admit: 2023-03-31 | Discharge: 2023-04-02 | DRG: 807 | Disposition: A | Discharge status: Home / Self Care | Payer: BC Managed Care – PPO | Attending: Family Medicine | Admitting: Family Medicine

## 2023-03-31 ENCOUNTER — Inpatient Hospital Stay (HOSPITAL_COMMUNITY): Payer: BC Managed Care – PPO

## 2023-03-31 DIAGNOSIS — O403XX0 Polyhydramnios, third trimester, not applicable or unspecified: Principal | ICD-10-CM | POA: Diagnosis present

## 2023-03-31 DIAGNOSIS — O165 Unspecified maternal hypertension, complicating the puerperium: Secondary | ICD-10-CM | POA: Diagnosis not present

## 2023-03-31 DIAGNOSIS — Z7982 Long term (current) use of aspirin: Secondary | ICD-10-CM

## 2023-03-31 DIAGNOSIS — O99214 Obesity complicating childbirth: Secondary | ICD-10-CM | POA: Diagnosis present

## 2023-03-31 DIAGNOSIS — Z3A39 39 weeks gestation of pregnancy: Secondary | ICD-10-CM

## 2023-03-31 DIAGNOSIS — Z8679 Personal history of other diseases of the circulatory system: Secondary | ICD-10-CM

## 2023-03-31 DIAGNOSIS — O0992 Supervision of high risk pregnancy, unspecified, second trimester: Secondary | ICD-10-CM

## 2023-03-31 DIAGNOSIS — Z8759 Personal history of other complications of pregnancy, childbirth and the puerperium: Secondary | ICD-10-CM

## 2023-03-31 DIAGNOSIS — O09293 Supervision of pregnancy with other poor reproductive or obstetric history, third trimester: Secondary | ICD-10-CM | POA: Diagnosis not present

## 2023-03-31 DIAGNOSIS — O135 Gestational [pregnancy-induced] hypertension without significant proteinuria, complicating the puerperium: Secondary | ICD-10-CM | POA: Diagnosis not present

## 2023-03-31 DIAGNOSIS — O26893 Other specified pregnancy related conditions, third trimester: Secondary | ICD-10-CM | POA: Diagnosis present

## 2023-03-31 LAB — CBC
HCT: 31 % — ABNORMAL LOW (ref 36.0–46.0)
Hemoglobin: 10 g/dL — ABNORMAL LOW (ref 12.0–15.0)
MCH: 26.8 pg (ref 26.0–34.0)
MCHC: 32.3 g/dL (ref 30.0–36.0)
MCV: 83.1 fL (ref 80.0–100.0)
Platelets: 226 10*3/uL (ref 150–400)
RBC: 3.73 MIL/uL — ABNORMAL LOW (ref 3.87–5.11)
RDW: 15.9 % — ABNORMAL HIGH (ref 11.5–15.5)
WBC: 8.7 10*3/uL (ref 4.0–10.5)
nRBC: 0 % (ref 0.0–0.2)

## 2023-03-31 LAB — TYPE AND SCREEN
ABO/RH(D): O POS
Antibody Screen: NEGATIVE

## 2023-03-31 LAB — RPR: RPR Ser Ql: NONREACTIVE

## 2023-03-31 MED ORDER — TERBUTALINE SULFATE 1 MG/ML IJ SOLN: 0.2500 mg | Freq: Once | INTRAMUSCULAR | Status: DC | PRN

## 2023-03-31 MED ORDER — OXYTOCIN BOLUS FROM INFUSION
333.0000 mL | Freq: Once | INTRAVENOUS | Status: AC
  Administered 2023-03-31: 333 mL via INTRAVENOUS

## 2023-03-31 MED ORDER — PRENATAL MULTIVITAMIN CH
1.0000 | ORAL_TABLET | Freq: Every day | ORAL | Status: DC
  Administered 2023-03-31 – 2023-04-02 (×3): 1 via ORAL
  Filled 2023-03-31 (×3): qty 1

## 2023-03-31 MED ORDER — ONDANSETRON HCL 4 MG/2ML IJ SOLN: 4.0000 mg | Freq: Four times a day (QID) | INTRAMUSCULAR | Status: DC | PRN

## 2023-03-31 MED ORDER — LACTATED RINGERS IV SOLN: INTRAVENOUS | Status: DC

## 2023-03-31 MED ORDER — DIPHENHYDRAMINE HCL 50 MG/ML IJ SOLN: 12.5000 mg | INTRAMUSCULAR | Status: DC | PRN

## 2023-03-31 MED ORDER — ZOLPIDEM TARTRATE 5 MG PO TABS: 5.0000 mg | ORAL_TABLET | Freq: Every evening | ORAL | Status: DC | PRN

## 2023-03-31 MED ORDER — EPHEDRINE 5 MG/ML INJ: 10.0000 mg | INTRAVENOUS | Status: DC | PRN

## 2023-03-31 MED ORDER — OXYCODONE-ACETAMINOPHEN 5-325 MG PO TABS: 2.0000 | ORAL_TABLET | ORAL | Status: DC | PRN

## 2023-03-31 MED ORDER — FUROSEMIDE 20 MG PO TABS
20.0000 mg | ORAL_TABLET | Freq: Every day | ORAL | Status: DC
  Administered 2023-03-31 – 2023-04-02 (×3): 20 mg via ORAL
  Filled 2023-03-31 (×3): qty 1

## 2023-03-31 MED ORDER — WITCH HAZEL-GLYCERIN EX PADS: 1.0000 | MEDICATED_PAD | CUTANEOUS | Status: DC | PRN

## 2023-03-31 MED ORDER — ONDANSETRON HCL 4 MG/2ML IJ SOLN: 4.0000 mg | INTRAMUSCULAR | Status: DC | PRN

## 2023-03-31 MED ORDER — DIPHENHYDRAMINE HCL 25 MG PO CAPS: 25.0000 mg | ORAL_CAPSULE | Freq: Four times a day (QID) | ORAL | Status: DC | PRN

## 2023-03-31 MED ORDER — SENNOSIDES-DOCUSATE SODIUM 8.6-50 MG PO TABS
2.0000 | ORAL_TABLET | Freq: Every day | ORAL | Status: DC
  Administered 2023-04-01 – 2023-04-02 (×2): 2 via ORAL
  Filled 2023-03-31 (×2): qty 2

## 2023-03-31 MED ORDER — MISOPROSTOL 25 MCG QUARTER TABLET: 25.0000 ug | ORAL_TABLET | Freq: Once | ORAL | Status: DC

## 2023-03-31 MED ORDER — OXYTOCIN-SODIUM CHLORIDE 30-0.9 UT/500ML-% IV SOLN
1.0000 m[IU]/min | INTRAVENOUS | Status: DC
  Administered 2023-03-31: 2 m[IU]/min via INTRAVENOUS
  Filled 2023-03-31: qty 500

## 2023-03-31 MED ORDER — FLEET ENEMA 7-19 GM/118ML RE ENEM: 1.0000 | ENEMA | RECTAL | Status: DC | PRN

## 2023-03-31 MED ORDER — OXYTOCIN-SODIUM CHLORIDE 30-0.9 UT/500ML-% IV SOLN
2.5000 [IU]/h | INTRAVENOUS | Status: DC
  Administered 2023-03-31: 2.5 [IU]/h via INTRAVENOUS

## 2023-03-31 MED ORDER — MISOPROSTOL 50MCG HALF TABLET
50.0000 ug | ORAL_TABLET | Freq: Once | ORAL | Status: AC
  Administered 2023-03-31: 50 ug via ORAL
  Filled 2023-03-31: qty 1

## 2023-03-31 MED ORDER — IBUPROFEN 600 MG PO TABS
600.0000 mg | ORAL_TABLET | Freq: Four times a day (QID) | ORAL | Status: DC
  Administered 2023-03-31 – 2023-04-02 (×9): 600 mg via ORAL
  Filled 2023-03-31 (×9): qty 1

## 2023-03-31 MED ORDER — TETANUS-DIPHTH-ACELL PERTUSSIS 5-2.5-18.5 LF-MCG/0.5 IM SUSY: 0.5000 mL | PREFILLED_SYRINGE | Freq: Once | INTRAMUSCULAR | Status: DC

## 2023-03-31 MED ORDER — FENTANYL CITRATE (PF) 100 MCG/2ML IJ SOLN
50.0000 ug | INTRAMUSCULAR | Status: DC | PRN
  Administered 2023-03-31: 100 ug via INTRAVENOUS

## 2023-03-31 MED ORDER — FENTANYL-BUPIVACAINE-NACL 0.5-0.125-0.9 MG/250ML-% EP SOLN: 12.0000 mL/h | EPIDURAL | Status: DC | PRN

## 2023-03-31 MED ORDER — PHENYLEPHRINE 80 MCG/ML (10ML) SYRINGE FOR IV PUSH (FOR BLOOD PRESSURE SUPPORT): 80.0000 ug | PREFILLED_SYRINGE | INTRAVENOUS | Status: DC | PRN

## 2023-03-31 MED ORDER — LACTATED RINGERS IV SOLN
500.0000 mL | INTRAVENOUS | Status: DC | PRN
  Administered 2023-03-31 (×2): 500 mL via INTRAVENOUS

## 2023-03-31 MED ORDER — OXYCODONE-ACETAMINOPHEN 5-325 MG PO TABS: 1.0000 | ORAL_TABLET | ORAL | Status: DC | PRN

## 2023-03-31 MED ORDER — ACETAMINOPHEN 325 MG PO TABS
650.0000 mg | ORAL_TABLET | ORAL | Status: DC | PRN
  Administered 2023-04-01: 650 mg via ORAL
  Filled 2023-03-31: qty 2

## 2023-03-31 MED ORDER — MISOPROSTOL 25 MCG QUARTER TABLET
25.0000 ug | ORAL_TABLET | Freq: Once | ORAL | Status: AC
  Administered 2023-03-31: 25 ug via VAGINAL
  Filled 2023-03-31: qty 1

## 2023-03-31 MED ORDER — SIMETHICONE 80 MG PO CHEW: 80.0000 mg | CHEWABLE_TABLET | ORAL | Status: DC | PRN

## 2023-03-31 MED ORDER — DIBUCAINE (PERIANAL) 1 % EX OINT: 1.0000 | TOPICAL_OINTMENT | CUTANEOUS | Status: DC | PRN

## 2023-03-31 MED ORDER — BENZOCAINE-MENTHOL 20-0.5 % EX AERO
1.0000 | INHALATION_SPRAY | CUTANEOUS | Status: DC | PRN
  Administered 2023-03-31: 1 via TOPICAL
  Filled 2023-03-31: qty 56

## 2023-03-31 MED ORDER — LACTATED RINGERS IV SOLN: 500.0000 mL | Freq: Once | INTRAVENOUS | Status: DC

## 2023-03-31 MED ORDER — LIDOCAINE HCL (PF) 1 % IJ SOLN
30.0000 mL | INTRAMUSCULAR | Status: AC | PRN
  Administered 2023-03-31: 30 mL via SUBCUTANEOUS
  Filled 2023-03-31: qty 30

## 2023-03-31 MED ORDER — FENTANYL CITRATE (PF) 100 MCG/2ML IJ SOLN
INTRAMUSCULAR | Status: AC
  Filled 2023-03-31: qty 2

## 2023-03-31 MED ORDER — ACETAMINOPHEN 325 MG PO TABS: 650.0000 mg | ORAL_TABLET | ORAL | Status: DC | PRN

## 2023-03-31 MED ORDER — ONDANSETRON HCL 4 MG PO TABS: 4.0000 mg | ORAL_TABLET | ORAL | Status: DC | PRN

## 2023-03-31 MED ORDER — SOD CITRATE-CITRIC ACID 500-334 MG/5ML PO SOLN: 30.0000 mL | ORAL | Status: DC | PRN

## 2023-03-31 MED ORDER — COCONUT OIL OIL: 1.0000 | TOPICAL_OIL | Status: DC | PRN

## 2023-03-31 NOTE — Discharge Summary (Shared)
Postpartum Discharge Summary  Date of Service updated***     Patient Name: Natalie Pierce DOB: 03-05-1996 MRN: [id number]  Date of admission: 03/31/2023 Delivery date:03/31/2023  Delivering provider: Lavonda Jumbo  Date of discharge: 03/31/2023  Admitting diagnosis: History of IUFD [Z87.59] Intrauterine pregnancy: [redacted]w[redacted]d     Secondary diagnosis:  Principal Problem:   History of IUFD at 39 weeks Active Problems:   History of postpartum hypertension   Supervision of high risk pregnancy in second trimester   Polyhydramnios affecting pregnancy in third trimester  Additional problems: ***    Discharge diagnosis: {DX.:23714}                                              Post partum procedures:{Postpartum procedures:23558} Augmentation: Cytotec Complications: None  Hospital course: Induction of Labor With Vaginal Delivery   27 y.o. yo [phone] at [redacted]w[redacted]d was admitted to the hospital 03/31/2023 for induction of labor.  Indication for induction: Elective.  Patient had an labor course complicated by thick MSF. Membrane Rupture Time/Date: 9:37 AM ,03/31/2023   Delivery Method:Vaginal, Spontaneous  Episiotomy: None  Lacerations:  1st degree;Perineal  Details of delivery can be found in separate delivery note.  Patient had a postpartum course complicated by***. Patient is discharged home 03/31/23.  Newborn Data: Birth date:03/31/2023  Birth time:9:47 AM  Gender:Female  Living status:Living  Apgars:9 ,9  Weight:3079 g   Magnesium Sulfate received: No BMZ received: No Rhophylac:{Rhophylac received:30440032} MMR:{MMR:30440033} T-DaP:{Tdap:23962} Flu: {ACZ:66063} Transfusion:{Transfusion received:30440034}  Physical exam  Vitals:   03/31/23 0900 03/31/23 1011 03/31/23 1030 03/31/23 1101  BP: 123/86 134/86 122/87 122/89  Pulse: 79 88 84 68  Resp:      Temp:      TempSrc:      SpO2:      Weight:      Height:       General: {Exam; general:21111117} Lochia: {Desc;  appropriate/inappropriate:30686::"appropriate"} Uterine Fundus: {Desc; firm/soft:30687} Incision: {Exam; incision:21111123} DVT Evaluation: {Exam; dvt:2111122} Labs: Lab Results  Component Value Date   WBC 8.7 03/31/2023   HGB 10.0 (L) 03/31/2023   HCT 31.0 (L) 03/31/2023   MCV 83.1 03/31/2023   PLT 226 03/31/2023      Latest Ref Rng & Units 10/30/2022    3:33 PM  CMP  Glucose 70 - 99 mg/dL 88   BUN 6 - 20 mg/dL 5   Creatinine 0.16 - 0.10 mg/dL 9.32   Sodium 355 - 732 mmol/L 138   Potassium 3.5 - 5.2 mmol/L 4.3   Chloride 96 - 106 mmol/L 104   CO2 20 - 29 mmol/L 22   Calcium 8.7 - 10.2 mg/dL 9.9   Total Protein 6.0 - 8.5 g/dL 6.9   Total Bilirubin 0.0 - 1.2 mg/dL 0.2   Alkaline Phos 44 - 121 IU/L 75   AST 0 - 40 IU/L 12   ALT 0 - 32 IU/L 10    Edinburgh Score:    09/06/2022    2:26 PM  Edinburgh Postnatal Depression Scale Screening Tool  I have been able to laugh and see the funny side of things. 0  I have looked forward with enjoyment to things. 0  I have blamed myself unnecessarily when things went wrong. 0  I have been anxious or worried for no good reason. 0  I have felt scared or panicky for no good reason.  0  Things have been getting on top of me. 0  I have been so unhappy that I have had difficulty sleeping. 0  I have felt sad or miserable. 0  I have been so unhappy that I have been crying. 0  The thought of harming myself has occurred to me. 0  Edinburgh Postnatal Depression Scale Total 0     After visit meds:  Allergies as of 03/31/2023   No Known Allergies   Med Rec must be completed prior to using this [facility]***        Discharge home in stable condition Infant Feeding: {Baby feeding:23562} Infant Disposition:{CHL IP OB HOME WITH AVWUJW:11914} Discharge instruction: per After Visit Summary and Postpartum booklet. Activity: Advance as tolerated. Pelvic rest for 6 weeks.  Diet: {OB NWGN:56213086} Future Appointments: Future Appointments   Date Time Provider Department Center  04/04/2023  1:00 PM [facility] NURSE [facility]  04/04/2023  1:15 PM [facility] NST [facility]   Follow up Visit:  Message sent to [facility] by Autry-Lott on 03/31/2023  Please schedule this patient for a In person postpartum visit in 6 weeks with the following provider: MD and APP. Additional Postpartum F/U:BP check 1 week  High risk pregnancy complicated by:  polyhydramnios, hx of ppHTN, hx of IUFD Delivery mode:  Vaginal, Spontaneous  Anticipated Birth Control:  Nexplanon   03/31/2023 Simone Autry-Lott, DO

## 2023-03-31 NOTE — H&P (Signed)
OBSTETRIC ADMISSION HISTORY AND PHYSICAL  Natalie Pierce is a 27 y.o. female [phone] with IUP at [redacted]w[redacted]d by LMP presenting for scheduled IOL due to history of fetal demise at 49 weeks. She reports +FMs, No LOF, no VB, no blurry vision, headaches or peripheral edema, and RUQ pain.  She plans on breast feeding. She request nexplanon for birth control. She received her prenatal care at [facility], and transferred to [facility]   Dating: By LMP --->  Estimated Date of Delivery: 04/05/23  Sono:    @[redacted]w[redacted]d , CWD, normal anatomy, cephalic presentation, 3113g, 45% EFW   Prenatal History/Complications:  Mild polyhydramnios Obesity in pregnancy Short interpregnancy interval Previous IUFD at 39 weeks       Clinical Staff Provider  Office Location  [facility] Dating  04/05/2023, by Last Menstrual Period  Language  English Anatomy US     Flu Vaccine  offer Genetic Screen  NIPS: mat21 low risk  Afp wnl Inheritest neg 2019    TDaP vaccine   Declined  Hgb A1C or  GTT Early : neg Third trimester : 78/100/102  Covid declined   LAB RESULTS   Rhogam  O/Positive/-- (12/04 1509)  Blood Type O/Positive/-- (12/04 1509)   Feeding Plan breast Antibody Negative (12/04 1509)  Contraception Nexplanon Rubella 8.30 (12/04 1509)  Circumcision yes RPR Non Reactive (04/03 0859)   Pediatrician  [facility] HBsAg Negative (12/04 1509)   Support Person [facility] HIV Non Reactive (04/03 0859)  Prenatal Classes no Varicella        GBS  Negative  BTL Consent   Hep C Non Reactive (12/04 1509)   VBAC Consent   Pap       Diagnosis  Date Value Ref Range Status  07/14/2021     Final    - Negative for intraepithelial lesion or malignancy (NILM)        Hgb Electro         CF        SMA                Past Medical History: Past Medical History:  Diagnosis Date   Anemia     Past Surgical History: Past Surgical History:  Procedure Laterality Date   WISDOM TOOTH EXTRACTION      Obstetrical History: OB History      Gravida  4   Para  3   Term  3   Preterm  0   AB  0   Living  2      SAB  0   IAB  0   Ectopic  0   Multiple  0   Live Births  2           Social History Social History   Socioeconomic History   Marital status: Single    Spouse name: Not on file   Number of children: 2   Years of education: 16   Highest education level: Not on file  Occupational History   Occupation: Designer, industrial/product  Tobacco Use   Smoking status: Never   Smokeless tobacco: Never  Vaping Use   Vaping Use: Never used  Substance and Sexual Activity   Alcohol use: Not Currently    Comment: Occ   Drug use: No   Sexual activity: Yes    Partners: Male    Birth control/protection: None  Other Topics Concern   Not on file  Social History Narrative   Not on file   Social Determinants of  Health   Financial Resource Strain: Low Risk  (09/06/2022)   Overall Financial Resource Strain (CARDIA)    Difficulty of Paying Living Expenses: Not very hard  Food Insecurity: No Food Insecurity (03/31/2023)   Hunger Vital Sign    Worried About Running Out of Food in the Last Year: Never true    Ran Out of Food in the Last Year: Never true  Transportation Needs: No Transportation Needs (03/31/2023)   PRAPARE - Administrator, Civil Service (Medical): No    Lack of Transportation (Non-Medical): No  Physical Activity: Inactive (09/06/2022)   Exercise Vital Sign    Days of Exercise per Week: 0 days    Minutes of Exercise per Session: 0 min  Stress: No Stress Concern Present (09/06/2022)   [employer] of Occupational Health - Occupational Stress Questionnaire    Feeling of Stress : Not at all  Social Connections: Unknown (09/06/2022)   Social Connection and Isolation Panel [NHANES]    Frequency of Communication with Friends and Family: Twice a week    Frequency of Social Gatherings with Friends and Family: Once a week    Attends Religious Services: More than 4 times per year    Active Member  of [employer] or Organizations: No    Attends Engineer, structural: Never    Marital Status: Not on file    Family History: Family History  Problem Relation Age of Onset   Healthy Mother    Healthy Father    Hypertension Brother    Healthy Brother    Healthy Brother    Healthy Brother    Lupus Maternal Grandmother    Healthy Maternal Grandfather    Healthy Paternal Grandmother    Healthy Paternal Grandfather    Healthy Half-Sister    Breast cancer Neg Hx    Ovarian cancer Neg Hx    Colon cancer Neg Hx    Asthma Neg Hx    Cancer Neg Hx    Diabetes Neg Hx    Heart disease Neg Hx     Allergies: No Known Allergies  Medications Prior to Admission  Medication Sig Dispense Refill Last Dose   aspirin EC 81 MG tablet Take 1 tablet (81 mg total) by mouth daily. Swallow whole. 30 tablet 12    Prenatal Vit-Fe Fumarate-FA (PRENATAL VITAMINS PO) Take by mouth.        Review of Systems   All systems reviewed and negative except as stated in HPI  Blood pressure 125/74, pulse 94, temperature 98.3 F (36.8 C), temperature source Oral, resp. rate 16, height 5\' 5"  (1.651 m), weight 95.1 kg, last menstrual period 06/29/2022, SpO2 100 %, currently breastfeeding.  General appearance: alert, cooperative, and appears stated age Lungs: clear to auscultation bilaterally Heart: regular rate and rhythm Abdomen: soft, non-tender; bowel sounds normal Pelvic: see below Extremities: Homans sign is negative, no sign of DVT  Presentation: cephalic  Fetal monitoring: 140bpm, moderate, +accels, no decels Uterine activity: contractions every 2-3 mins, mild  Dilation: 2.5 Effacement (%): 50 Station: -2 Exam by:: Quintin Alto, RN   Prenatal labs: ABO, Rh: --/--/O POS (06/15 0052) Antibody: NEG (06/15 0052) Rubella: 8.30 (12/04 1509) RPR: Non Reactive (04/03 0859)  HBsAg: Negative (12/04 1509)  HIV: Non Reactive (04/03 0859)  GBS: Negative/-- (05/29 1516)  2 hr Glucola normal Genetic  screening  low risk Anatomy US Normal  Prenatal Transfer Tool  Maternal Diabetes: No Genetic Screening: Normal Maternal Ultrasounds/Referrals: Normal Fetal Ultrasounds or other Referrals:  None Maternal Substance Abuse:  No Significant Maternal Medications:  None Significant Maternal Lab Results:  Group B Strep negative Number of Prenatal Visits:greater than 3 verified prenatal visits Other Comments:  None  Results for orders placed or performed during the hospital encounter of 03/31/23 (from the past 24 hour(s))  CBC   Collection Time: 03/31/23 12:50 AM  Result Value Ref Range   WBC 8.7 4.0 - 10.5 K/uL   RBC 3.73 (L) 3.87 - 5.11 MIL/uL   Hemoglobin 10.0 (L) 12.0 - 15.0 g/dL   HCT 16.1 (L) 09.6 - 04.5 %   MCV 83.1 80.0 - 100.0 fL   MCH 26.8 26.0 - 34.0 pg   MCHC 32.3 30.0 - 36.0 g/dL   RDW 40.9 (H) 81.1 - 91.4 %   Platelets 226 150 - 400 K/uL   nRBC 0.0 0.0 - 0.2 %  Type and screen   Collection Time: 03/31/23 12:52 AM  Result Value Ref Range   ABO/RH(D) O POS    Antibody Screen NEG    Sample Expiration      04/03/2023,2359 Performed at [facility], [address] [place], [place] [address]     Patient Active Problem List   Diagnosis Date Noted   Polyhydramnios affecting pregnancy in third trimester 02/28/2023   Supervision of high risk pregnancy in second trimester 09/06/2022   History of postpartum hypertension 01/18/2022   History of IUFD at 56 weeks 01/04/2022    Assessment/Plan:  ASLY PYLES is a 27 y.o. [id number] at [redacted]w[redacted]d here for IOL due to history of IUFD at 39 weeks for P2. Also has mild polyhydramions. No other concerns  #Labor:admit to labor and delivery. Start IOL with cytotec 50PO, 25 vaginal #Pain: Family support, IV fentanyl, epidural when ready  #FWB: Cat 1 #ID:  GBS neg #MOF: breast #MOC:nexplanon outpatient #Circ:  Yes  Sheppard Evens MD MPH OB Fellow, Faculty Practice [facility], Center for [facility] 03/31/2023

## 2023-03-31 NOTE — Progress Notes (Signed)
Notified Dr. Claudean Severance of elevated blood pressure upon admission to mother baby and blood pressure one hour later. Dr. Claudean Severance ordered to call MD if patient has 2 more blood pressures in a row of 135/90 or greater. Earl Gala, Linda Hedges [place]

## 2023-03-31 NOTE — Progress Notes (Signed)
Infant was sent to NICU. Set patient up with a double electric breast pump. Demonstrated and explained to patient how to use DEBP, cleaning and frequency of use. Earl Gala, Linda Hedges [place]

## 2023-04-01 NOTE — Lactation Note (Signed)
This note was copied from a baby's chart.  NICU Lactation Consultation Note  Patient Name: Natalie Pierce [id number] Date: 04/01/2023 Age:28 hours  Reason for consult: Initial assessment; NICU baby; Term; Other (Comment); Hyperbilirubinemia (DAT (+))  SUBJECTIVE Visited with family of 73 hours old FT NICU female; Natalie Pierce is a P3 and reports she's been pumping consistently since she was set up with the DEBP last night, praised her for her efforts. Baby "Natalie Pierce" is currently undergoing phototherapy and is on IV fluids due to hyperbilirubinemia. Her plan is to do both, breast along with pumping & bottle feeding with EBM/formula. Reviewed pumping schedule, lactogenesis II, newborn jaundice and anticipatory guidelines.   OBJECTIVE Infant data: Mother's Current Feeding Choice: Breast Milk and Formula  Infant feeding assessment Scale for Readiness: 1 Scale for Quality: 2   Maternal data: [id number]  Vaginal, Spontaneous Has patient been taught Hand Expression?: Yes Hand Expression Comments: droplets on L side but none on R side yet Significant Breast History:: minimal breast changes during the pregnancy Current breast feeding challenges:: NICU admission Does the patient have breastfeeding experience prior to this delivery?: Yes How long did the patient breastfeed?: 7 months for each previous baby Pumping frequency: 3 times/24 hours, hospital grade pump was set up at 10 hours post-partum Pumped volume: 0 mL (droplets on L side) Flange Size: 21 Risk factor for low milk supply:: infant separation  Pump: Hands Free, Personal  ASSESSMENT Infant: LATCH Documentation Latch: 1 Audible Swallowing: 0 Type of Nipple: 2 Comfort (Breast/Nipple): 2 Hold (Positioning): 1 LATCH Score: 6  Feeding Status: Ad lib  Maternal: Milk volume: Normal  INTERVENTIONS/PLAN Interventions: Interventions: Breast feeding basics reviewed; DEBP; Education; [employer] brochure (Medela Freestyle and hands  free pump from [employer]) Tools: Pump; Flanges Pump Education: Setup, frequency, and cleaning; Milk Storage  Plan: Encouraged pumping every 3 hours, ideally 8 pumping sessions/24 hours She'll call for assistance when she's ready to take baby to breast, probably after phototherapy is d/c  Natalie Pierce present and supportive. All questions and concerns answered, family to contact [facility] services PRN.  Consult Status: NICU follow-up NICU Follow-up type: New admission follow up   Mischelle Reeg S Faiz Weber 04/01/2023, 11:30 AM

## 2023-04-01 NOTE — Progress Notes (Signed)
POSTPARTUM PROGRESS NOTE  PPD #1  Subjective:  Natalie Pierce is a 27 y.o. [id number] s/p NSVD at [redacted]w[redacted]d. Today she notes no acute complaints or concerns. She denies any problems with ambulating, voiding or po intake. Denies nausea or vomiting. She has passed flatus, no BM.  Pain is well-controlled.  Lochia appropriate Denies fever/chills/chest pain/SOB.   Objective: Blood pressure 127/85, pulse 76, temperature 97.6 F (36.4 C), temperature source Oral, resp. rate 16, height 5\' 5"  (1.651 m), weight 95.1 kg, last menstrual period 06/29/2022, SpO2 100 %, unknown if currently breastfeeding.  Physical Exam:  General: alert, cooperative and no distress Chest: no respiratory distress Heart: regular rate and rhythm Abdomen: soft, nontender Uterine Fundus: firm, below umbilicus DVT Evaluation: No calf swelling or tenderness Extremities: no edema Skin: warm, dry  No results found for this or any previous visit (from the past 24 hour(s)).  Assessment/Plan: Natalie Pierce is a 27 y.o. [phone] s/p NSVD at [redacted]w[redacted]d PPD#1 -pain well controlled -meeting postpartum milestones appropriately -Lasix ppx due to h/o postpartum HTN  Contraception: outpt nexplanon Feeding: breast, baby in NICU  Dispo: Continue routine postpartum care   LOS: 1 day   Myna Hidalgo, DO Faculty Attending, Center for [employer] 04/01/2023, 9:42 AM

## 2023-04-02 ENCOUNTER — Encounter: Payer: Self-pay | Admitting: *Deleted

## 2023-04-02 ENCOUNTER — Other Ambulatory Visit (HOSPITAL_COMMUNITY): Payer: Self-pay

## 2023-04-02 ENCOUNTER — Ambulatory Visit (HOSPITAL_COMMUNITY): Payer: Self-pay

## 2023-04-02 MED ORDER — FUROSEMIDE 20 MG PO TABS
20.0000 mg | ORAL_TABLET | Freq: Every day | ORAL | 0 refills | Status: AC
  Filled 2023-04-02: qty 3, 3d supply, fill #0

## 2023-04-02 MED ORDER — SENNOSIDES-DOCUSATE SODIUM 8.6-50 MG PO TABS
2.0000 | ORAL_TABLET | Freq: Every day | ORAL | 0 refills | Status: AC
  Filled 2023-04-02: qty 30, 15d supply, fill #0

## 2023-04-02 MED ORDER — POLYETHYLENE GLYCOL 3350 17 GM/SCOOP PO POWD
17.0000 g | Freq: Every day | ORAL | 0 refills | Status: AC
  Filled 2023-04-02: qty 476, 28d supply, fill #0

## 2023-04-02 MED ORDER — IBUPROFEN 600 MG PO TABS
600.0000 mg | ORAL_TABLET | Freq: Four times a day (QID) | ORAL | 0 refills | Status: AC
  Filled 2023-04-02: qty 30, 8d supply, fill #0

## 2023-04-02 MED ORDER — ACETAMINOPHEN 325 MG PO TABS
650.0000 mg | ORAL_TABLET | ORAL | 0 refills | Status: AC | PRN
  Filled 2023-04-02: qty 100, 9d supply, fill #0

## 2023-04-02 NOTE — Lactation Note (Signed)
This note was copied from a baby's chart.  NICU Lactation Consultation Note  Patient Name: Natalie Pierce [id number]'[id number] Date: 04/02/2023 Age:28 hours  Reason for consult: Follow-up assessment; NICU baby; Term; Other (Comment); Maternal discharge; Hyperbilirubinemia (DAT (+))  SUBJECTIVE Visited with family of 64 hours old FT NICU female; Natalie Pierce is a P3 and reports she's been pumping with both, the hospital grade pump and the hand pump. Explained the importance of consistent pumping with a DEBP for the onset of lactogenesis II, the prevention of engorgement and to protect her supply. Baby "Natalie Pierce" has been mainly doing bottles with Similac 20 calorie formula but she's planning on taking him to breast once he's off the lights, still on phototherapy. Ms. Eisele got discharged today. Reviewed discharge education, engorgement prevention/treatment, pumping schedule, pump settings, lactogenesis III and anticipatory guidelines.  OBJECTIVE Infant data: Mother's Current Feeding Choice: Breast Milk and Formula  Infant feeding assessment Scale for Readiness: 1 Scale for Quality: 2   Maternal data: [id number]  Vaginal, Spontaneous Has patient been taught Hand Expression?: Yes Hand Expression Comments: droplets on L side but none on R side yet Significant Breast History:: minimal breast changes during the pregnancy Current breast feeding challenges:: NICU admission Does the patient have breastfeeding experience prior to this delivery?: Yes How long did the patient breastfeed?: 7 months for each previous baby Pumping frequency: 3 times/24 hours; she's been using both, her hospital grade pump and the hand pump Pumped volume: 8 mL Flange Size: 21 Risk factor for low milk supply:: infant separation  Pump: Hands Free, Personal  ASSESSMENT Infant: Feeding Status: Ad lib  Maternal: Milk volume: Normal  INTERVENTIONS/PLAN Interventions: Interventions: Breast feeding basics reviewed; DEBP;  Education Discharge Education: Engorgement and breast care Tools: Pump; Flanges Pump Education: Setup, frequency, and cleaning; Milk Storage  Plan: Encouraged bilateral pumping every 3 hours, ideally 8 pumping sessions/24 hours She'll take all pump parts to baby's room after her discharge She'll switch her pump settings from initiation to maintenance mode once she starts getting 20 ml of EBM combined She'll call for assistance when she's ready to take baby to breast, probably after phototherapy is d/c   FOB present and supportive. All questions and concerns answered, family to contact [facility] services PRN.   Consult Status: NICU follow-up NICU Follow-up type: Verify absence of engorgement; Verify onset of copious milk   Melody Savidge S Lilburn Straw 04/02/2023, 7:06 PM

## 2023-04-02 NOTE — Progress Notes (Signed)
This RN went down to NICU to check on patient, she had been in NICU since shift change.  Ibuprofen given, Patient states she does not need anything at this time.  Requested patient to check in with MB Unit secretary when she returns to mother baby so we can do her vitals and assessment.  Patient states she will.  NICU RNs present in room.

## 2023-04-04 ENCOUNTER — Ambulatory Visit: Payer: BC Managed Care – PPO

## 2023-04-04 ENCOUNTER — Encounter: Payer: BC Managed Care – PPO | Admitting: Family Medicine

## 2023-04-05 ENCOUNTER — Ambulatory Visit: Payer: BC Managed Care – PPO

## 2023-04-09 ENCOUNTER — Ambulatory Visit (INDEPENDENT_AMBULATORY_CARE_PROVIDER_SITE_OTHER): Payer: BC Managed Care – PPO

## 2023-04-09 VITALS — BP 132/91 | HR 75 | Wt 195.0 lb

## 2023-04-09 DIAGNOSIS — R030 Elevated blood-pressure reading, without diagnosis of hypertension: Secondary | ICD-10-CM

## 2023-04-09 MED ORDER — NIFEDIPINE ER OSMOTIC RELEASE 30 MG PO TB24
30.0000 mg | ORAL_TABLET | Freq: Every day | ORAL | 2 refills | Status: DC
Start: 2023-04-09 — End: 2023-05-17

## 2023-04-09 NOTE — Progress Notes (Signed)
Subjective:  Natalie Pierce is a 27 y.o. female here for BP check.   Hypertension ROS: Patient denies any headaches, visual symptoms, RUQ/epigastric pain or other concerning symptoms.  Objective:  LMP 06/29/2022 (Exact Date)   Appearance alert, well appearing, and in no distress. General exam BP noted to be elevated today in office.    Assessment:   Blood Pressure needs further observation.   Plan:  Repeat B/P in 1 wk  . Start Procardia 30mg 

## 2023-04-11 ENCOUNTER — Encounter: Payer: BC Managed Care – PPO | Admitting: Family Medicine

## 2023-04-16 ENCOUNTER — Ambulatory Visit (INDEPENDENT_AMBULATORY_CARE_PROVIDER_SITE_OTHER): Payer: BC Managed Care – PPO

## 2023-04-16 VITALS — BP 135/88 | HR 64

## 2023-04-16 DIAGNOSIS — Z8679 Personal history of other diseases of the circulatory system: Secondary | ICD-10-CM

## 2023-04-16 DIAGNOSIS — Z0130 Encounter for examination of blood pressure without abnormal findings: Secondary | ICD-10-CM

## 2023-04-16 NOTE — Progress Notes (Signed)
Subjective:  Natalie Pierce is a 27 y.o. female here for BP check.   Hypertension ROS: Patient denies any headaches, visual symptoms, RUQ/epigastric pain or other concerning symptoms.  Objective:  LMP 06/29/2022 (Exact Date)   Appearance alert, well appearing, and in no distress. General exam BP noted to be 135/88 today in office.    Assessment:  Pt taking Procardia 30mg  once daily  Blood Pressure asymptomatic and no significant medication side effects noted.   Plan:  Current treatment plan is effective, no change in therapy.Marland Kitchen

## 2023-04-20 NOTE — Progress Notes (Signed)
Patient was assessed and managed by nursing staff during this encounter. I have reviewed the chart and agree with the documentation and plan. I have also made any necessary editorial changes.  Karlina Suares, MD 04/20/2023 3:48 PM   

## 2023-05-08 ENCOUNTER — Other Ambulatory Visit (HOSPITAL_COMMUNITY): Payer: Self-pay

## 2023-05-17 ENCOUNTER — Ambulatory Visit (INDEPENDENT_AMBULATORY_CARE_PROVIDER_SITE_OTHER): Payer: BC Managed Care – PPO | Admitting: Obstetrics and Gynecology

## 2023-05-17 ENCOUNTER — Encounter: Payer: Self-pay | Admitting: Obstetrics and Gynecology

## 2023-05-17 NOTE — Progress Notes (Signed)
    Post Partum Visit Note  Natalie Pierce is a 27 y.o. [id number] s/p 6/15 SVD/1st degree (repaired) at 39wks. Pregnancy c/b h/o IUFD, h/o PP HTN.   Anesthesia: none. Postpartum course has been . Baby is doing well. Baby is feeding by breast. Bleeding occasional spotting. Bowel function is normal. Bladder function is normal. Patient is sexually active. Postpartum depression screening: negative.  Patient went back to work earlier this week and is now only pumping 3x/day. Last sex a week ago.   Patient stopped the procardia at least a week ago.   Review of Systems Pertinent items are noted in HPI.  Objective:  BP 136/89   Pulse 73   Wt 194 lb 3.2 oz (88.1 kg)   LMP 06/29/2022 (Exact Date)   Breastfeeding Yes   BMI 32.32 kg/m    General: NAD Assessment:   Normal PP visit   Plan:  *PP: d/w her that period may start back up since she's breastfeeding less. Patient had nexplanon in the past and did well with and would like to get back on it. Recommend abstinence and RTC 2 weeks for UPT and nexplanon placement. D/w her pap due next year.  *PP HTN: f/u BP next visit  [place] Bing, MD Center for [facility], [facility]

## 2023-05-29 ENCOUNTER — Encounter: Payer: Self-pay | Admitting: *Deleted

## 2023-06-05 ENCOUNTER — Ambulatory Visit (INDEPENDENT_AMBULATORY_CARE_PROVIDER_SITE_OTHER): Payer: BC Managed Care – PPO | Admitting: Obstetrics and Gynecology

## 2023-06-05 ENCOUNTER — Encounter: Payer: Self-pay | Admitting: Obstetrics and Gynecology

## 2023-06-05 VITALS — BP 138/90 | HR 58 | Wt 193.0 lb

## 2023-06-05 DIAGNOSIS — Z3202 Encounter for pregnancy test, result negative: Secondary | ICD-10-CM

## 2023-06-05 DIAGNOSIS — Z30017 Encounter for initial prescription of implantable subdermal contraceptive: Secondary | ICD-10-CM | POA: Diagnosis not present

## 2023-06-05 HISTORY — PX: INSERTION OF IMPLANON ROD: OBO 1005

## 2023-06-05 LAB — POCT URINE PREGNANCY: Preg Test, Ur: NEGATIVE

## 2023-06-05 MED ORDER — ETONOGESTREL 68 MG ~~LOC~~ IMPL
68.0000 mg | DRUG_IMPLANT | Freq: Once | SUBCUTANEOUS | Status: AC
Start: 2023-06-05 — End: 2023-06-05
  Administered 2023-06-05: 68 mg via SUBCUTANEOUS

## 2023-06-05 NOTE — Progress Notes (Signed)
Pt here for Nexplanon Insertion.  LMP: No cycle yet breast feeding   Pt   any unprotected intercourse in last x 14 days.

## 2023-06-05 NOTE — Procedures (Signed)
Nexplanon Insertion Procedure Note Patient had nexplanon before and was amenorrheic on it. UPT negative and no intercourse in past two weeks  After informed consent was obtained, the patient's non-dominant left arm was chosen for insertion. The old site was approximately 8 cm proximal to the medial epicondyle in the sulcus between the biceps and triceps on the inner surface. The area was cleaned with alcohol then local anesthesia was infiltrated with 3 ml of 1% lidocaine along the planned insertion track. The area was prepped with betadine. Using sterile technique the Nexplanon device was inserted per manufacturer's guidelines in the subdermal connective tissue using the standard insertion technique without difficulty. Pressure was applied and the insertion site was hemostatic. The presence of the Nexplanon was confirmed immediately after insertion by palpation by both me and the patient and by checking the tip of needle for the absence of the insert.  A pressure dressing was applied.  The patient tolerated the procedure well.  Natalie Copa MD Attending Center for [employer] Midwife)

## 2024-03-24 IMAGING — CT CT ANGIO CHEST
2 of 7 series · 18 of 46 positions shown · IV contrast (agent unspecified)
Comparison: None.

CLINICAL DATA: Chest pain.

EXAM:
CT ANGIOGRAPHY CHEST WITH CONTRAST
TECHNIQUE: Multidetector CT imaging of the chest was performed using the
standard protocol during bolus administration of intravenous
contrast. Multiplanar CT image reconstructions and MIPs were
obtained to evaluate the vascular anatomy.

[Series 6: thins · axial · 0.67mm/px · z∈[-712,-464]mm · 15 of 400 slices shown]
[im 23/400  lung]
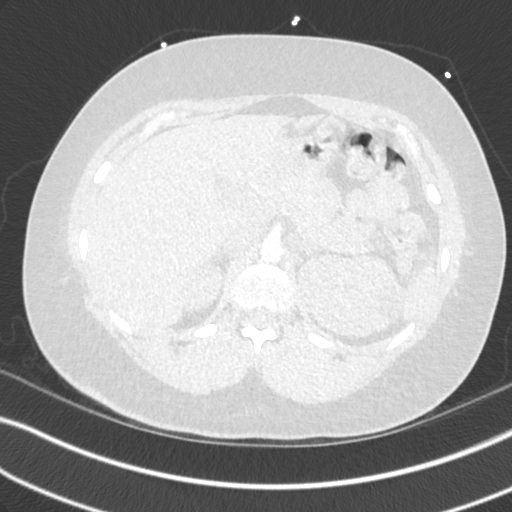
[im 45/400  soft-tissue]
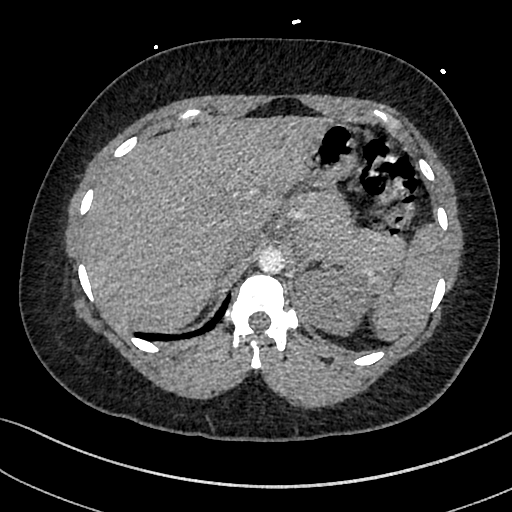
[im 67/400  lung]
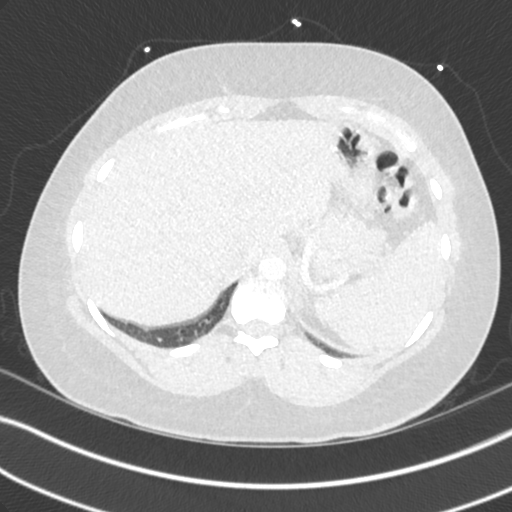
[im 89/400  soft-tissue]
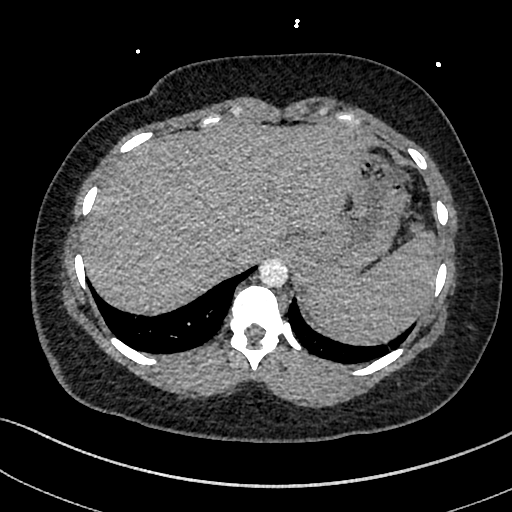
[im 134/400  lung]
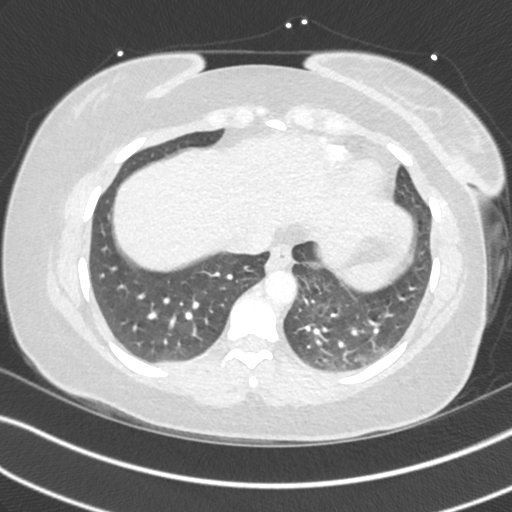
[im 156/400  soft-tissue]
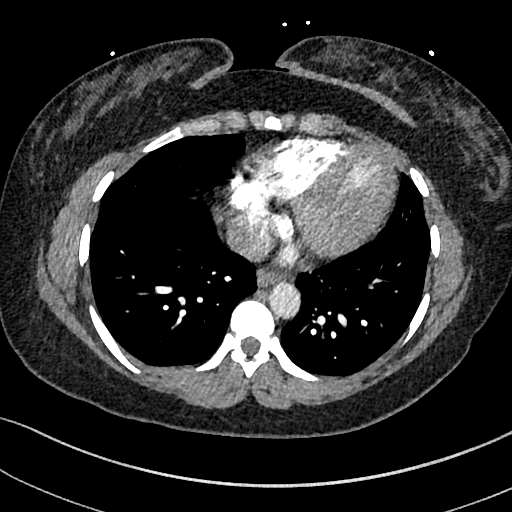
[im 178/400  lung]
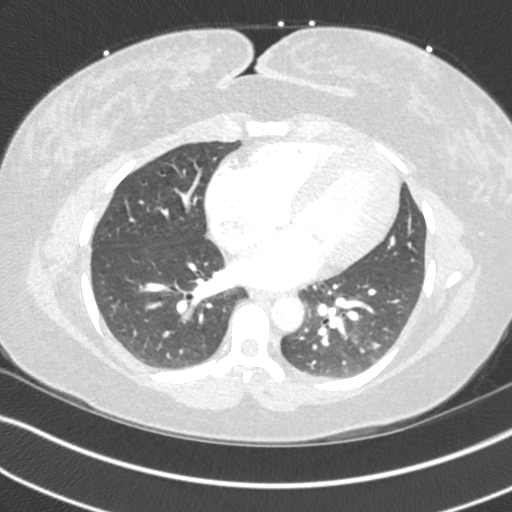
[im 200/400  soft-tissue]
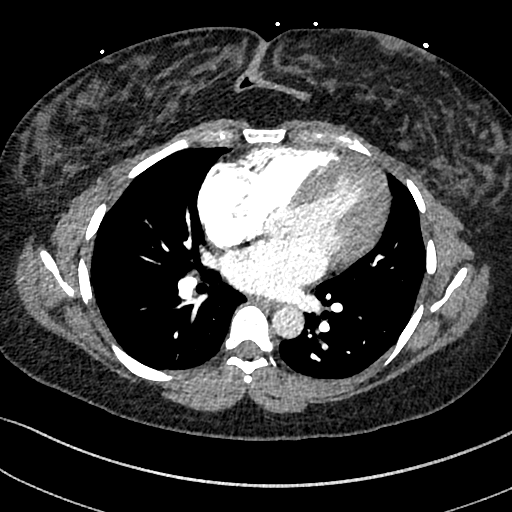
[im 222/400  lung]
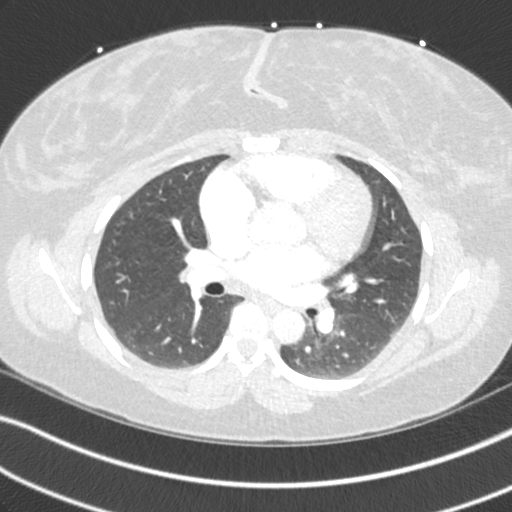
[im 244/400  soft-tissue]
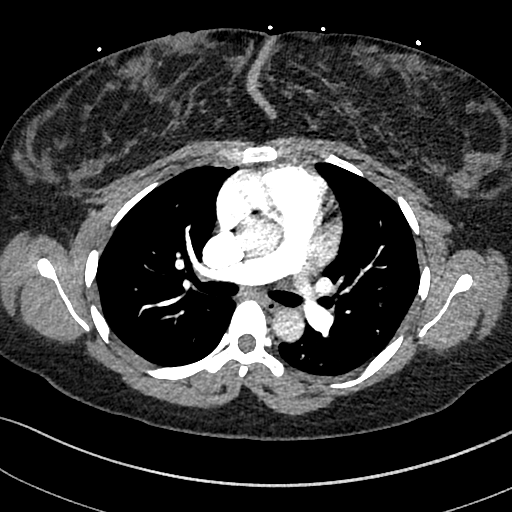
[im 267/400  lung]
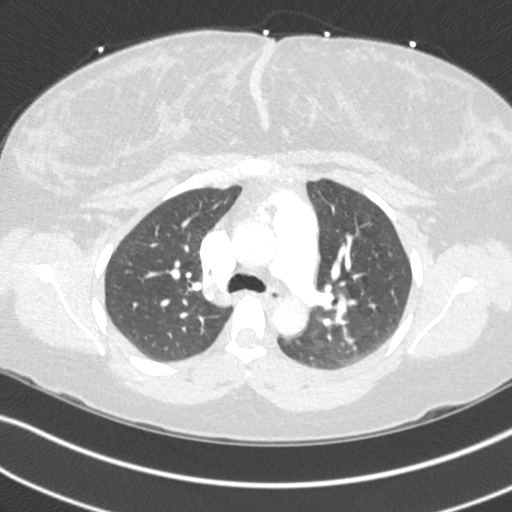
[im 311/400  soft-tissue]
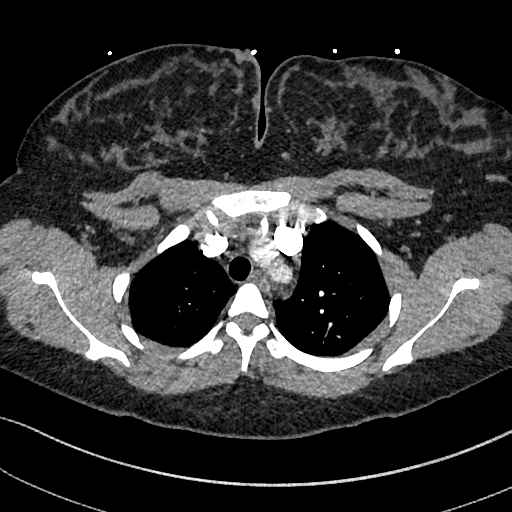
[im 333/400  lung]
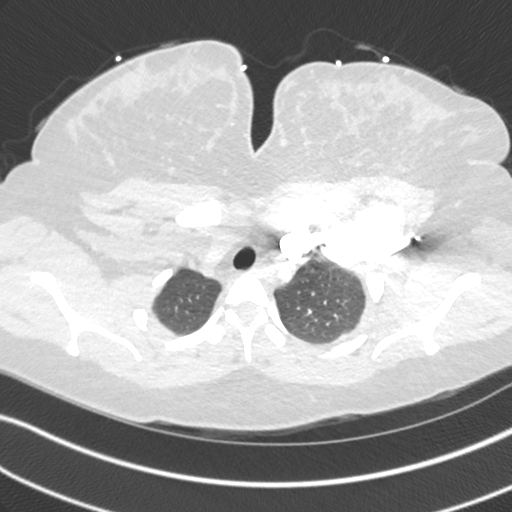
[im 355/400  soft-tissue]
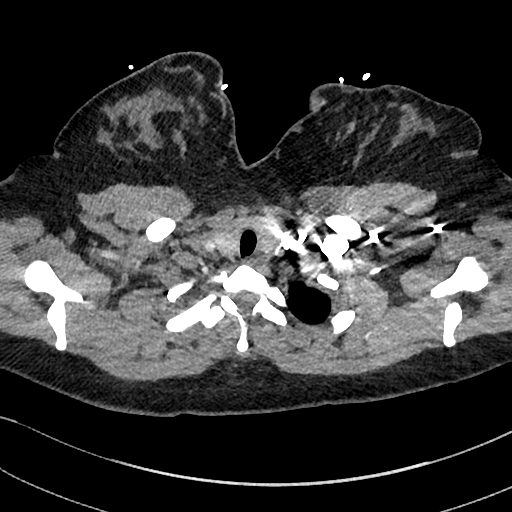
[im 377/400  lung]
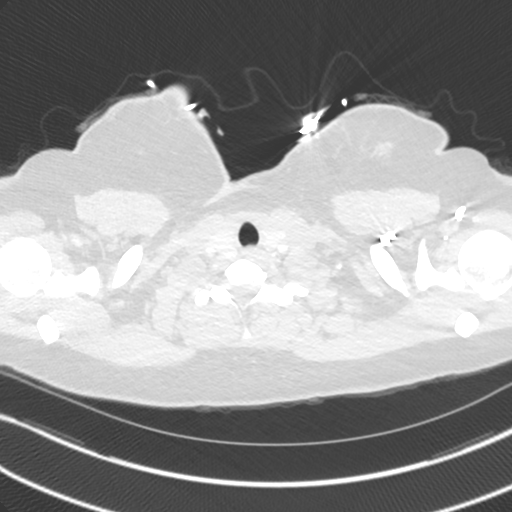

[Series 7: cor · coronal · 0.59mm/px · 3 of 142 slices shown]
[im 36/142  soft-tissue]
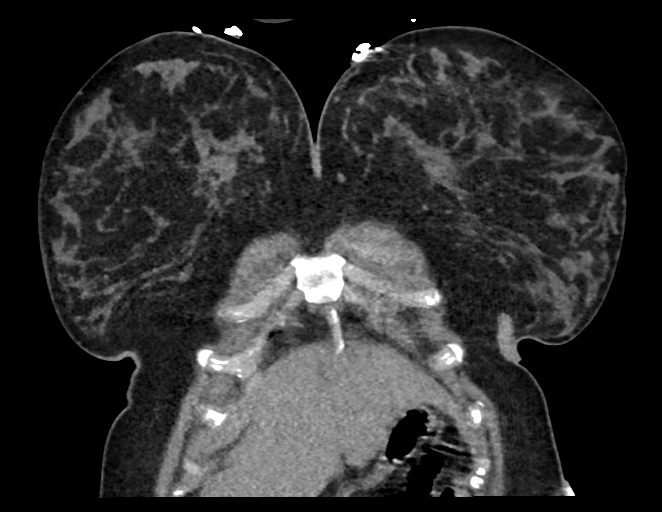
[im 71/142  soft-tissue]
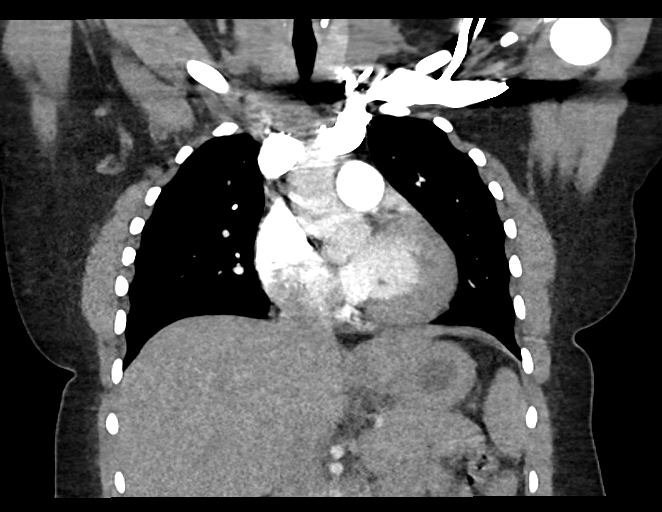
[im 106/142  soft-tissue]
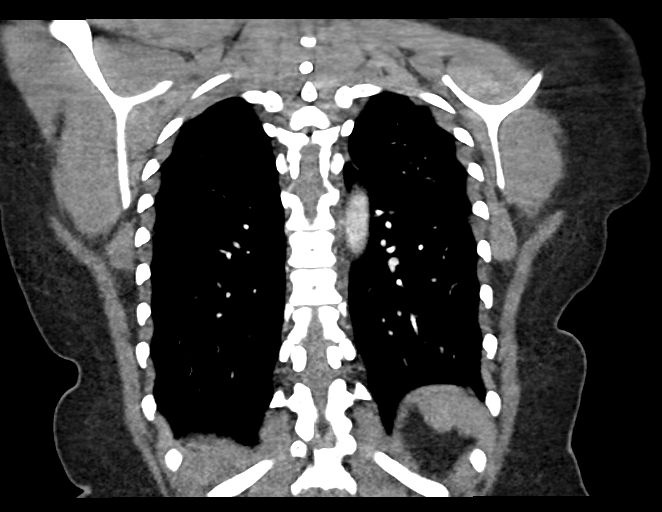

[18 of 46 positions shown; findings below may reference images not displayed]

RADIATION DOSE REDUCTION: This exam was performed according to the
departmental dose-optimization program which includes automated
exposure control, adjustment of the mA and/or kV according to
patient size and/or use of iterative reconstruction technique.

CONTRAST:  75mL OMNIPAQUE IOHEXOL 350 MG/ML SOLN
FINDINGS: Cardiovascular: The heart size is normal. No substantial pericardial
effusion. No thoracic aortic aneurysm. No substantial
atherosclerosis of the thoracic aorta. There is no filling defect
within the opacified pulmonary arteries to suggest the presence of
an acute pulmonary embolus.

Mediastinum/Nodes: No mediastinal lymphadenopathy. There is no hilar
lymphadenopathy. The esophagus has normal imaging features. There is
no axillary lymphadenopathy.

Lungs/Pleura: No suspicious pulmonary nodule or mass. No focal
airspace consolidation. No pleural effusion.

Upper Abdomen: 2.3 cm low-density lesion in the left kidney
approaches water density compatible with a simple cyst. No imaging
follow-up recommended.

Musculoskeletal: No worrisome lytic or sclerotic osseous
abnormality.

Review of the MIP images confirms the above findings.
IMPRESSION: 1. No CT evidence for acute pulmonary embolus.
2. No acute findings in the chest.

## 2024-06-03 ENCOUNTER — Ambulatory Visit

## 2024-06-04 ENCOUNTER — Ambulatory Visit (INDEPENDENT_AMBULATORY_CARE_PROVIDER_SITE_OTHER)

## 2024-06-04 DIAGNOSIS — R300 Dysuria: Secondary | ICD-10-CM | POA: Diagnosis not present

## 2024-06-04 LAB — POCT URINALYSIS DIPSTICK: Protein, UA: POSITIVE — AB

## 2024-06-04 NOTE — Progress Notes (Signed)
 SUBJECTIVE:  28 y.o. female complains of  dysuria, and dark urine Denies abnormal vaginal bleeding or significant pelvic pain or fever.Denies history of known exposure to STD.  No LMP recorded.  OBJECTIVE:  She appears alert, well appearing, in no apparent distress Urine dipstick: positive for RBC's, positive for protein, and positive for leukocytes.  ASSESSMENT:  Dysuria    PLAN:  urine culture sent to lab.Macrobid  offered pt rather wait for results then try any medication at this time. Treatment: To be determined once lab results are received ROV prn if symptoms persist or worsen.

## 2024-06-07 LAB — URINE CULTURE

## 2024-06-09 ENCOUNTER — Ambulatory Visit: Payer: Self-pay | Admitting: Certified Nurse Midwife

## 2024-06-09 DIAGNOSIS — N390 Urinary tract infection, site not specified: Secondary | ICD-10-CM

## 2024-06-09 MED ORDER — CEFADROXIL 500 MG PO CAPS: 500.0000 mg | ORAL_CAPSULE | Freq: Two times a day (BID) | ORAL | 0 refills | Status: AC
